# Patient Record
Sex: Female | Born: 1990 | Race: White | Hispanic: No | Marital: Married | Smoking: Never smoker
Health system: Southern US, Community
[De-identification: ages and names within clinical notes are randomized; demographics above are authoritative.]

## PROBLEM LIST (undated history)

## (undated) DIAGNOSIS — O139 Gestational [pregnancy-induced] hypertension without significant proteinuria, unspecified trimester: Secondary | ICD-10-CM

## (undated) DIAGNOSIS — Z789 Other specified health status: Secondary | ICD-10-CM

## (undated) DIAGNOSIS — I10 Essential (primary) hypertension: Secondary | ICD-10-CM

---
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
## 2013-07-28 IMAGING — CR DG ABDOMEN ACUTE W/ 1V CHEST
3 series · 3 of 3 positions shown · non-contrast
Comparison: None.

CLINICAL DATA: 20-year-old female with abdominal pain, nausea.

ACUTE ABDOMEN SERIES (ABDOMEN 2 VIEW & CHEST 1 VIEW)

[w chest pa]
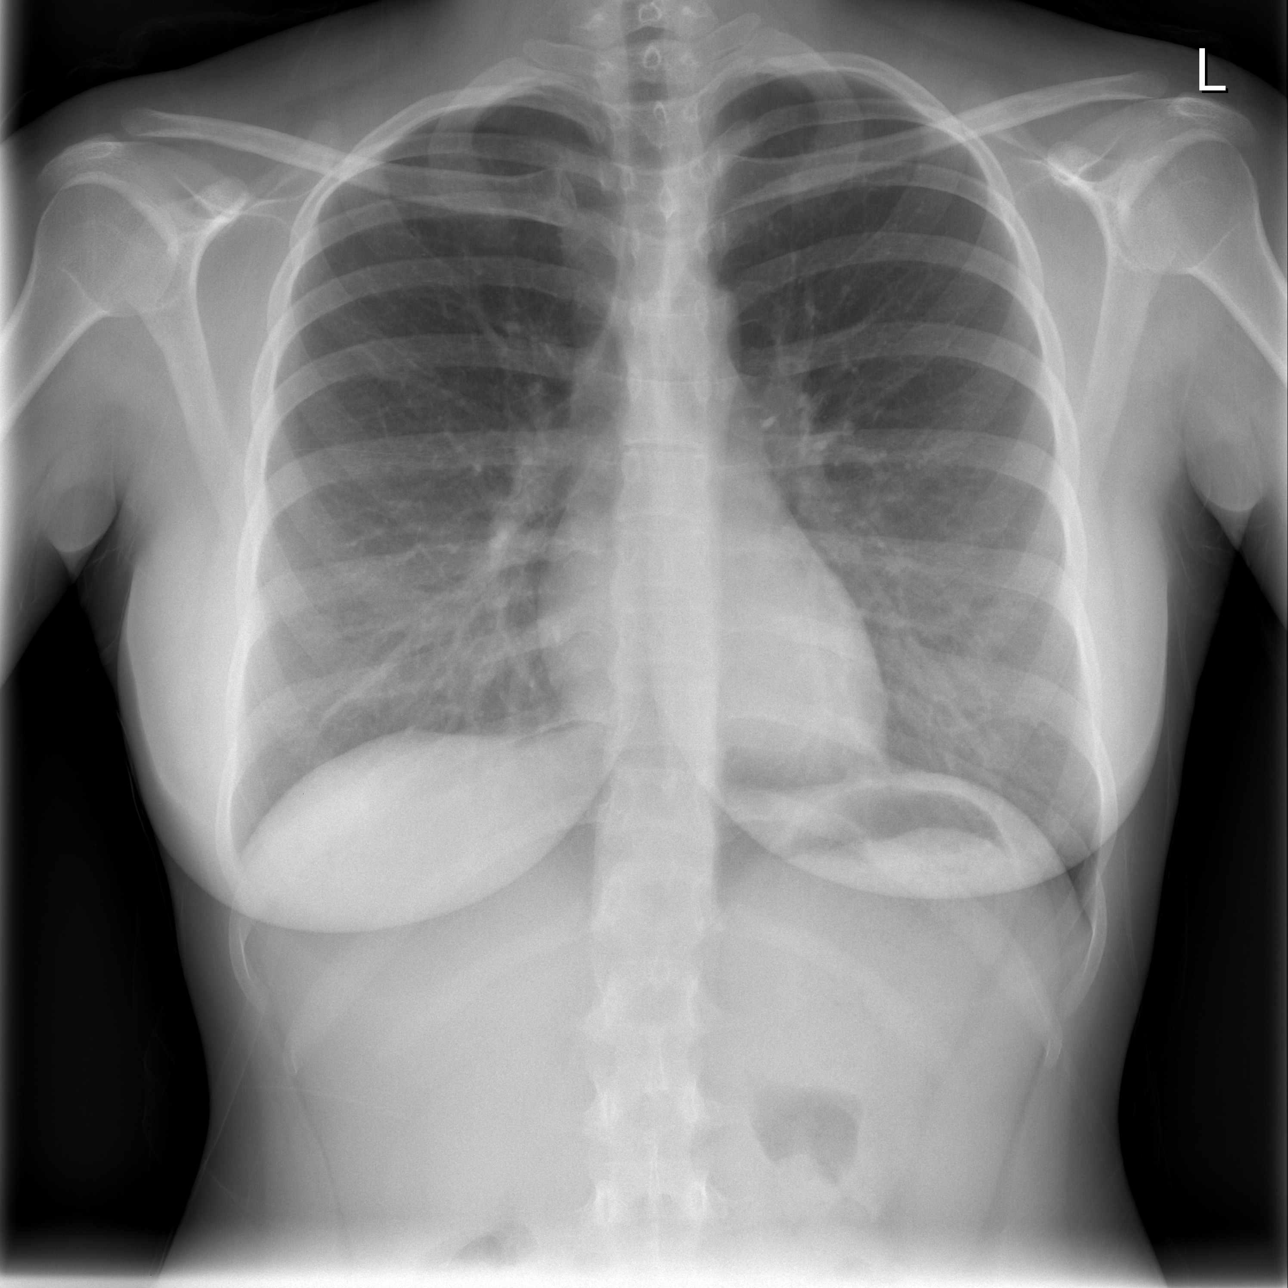

[w abdomen upright *]
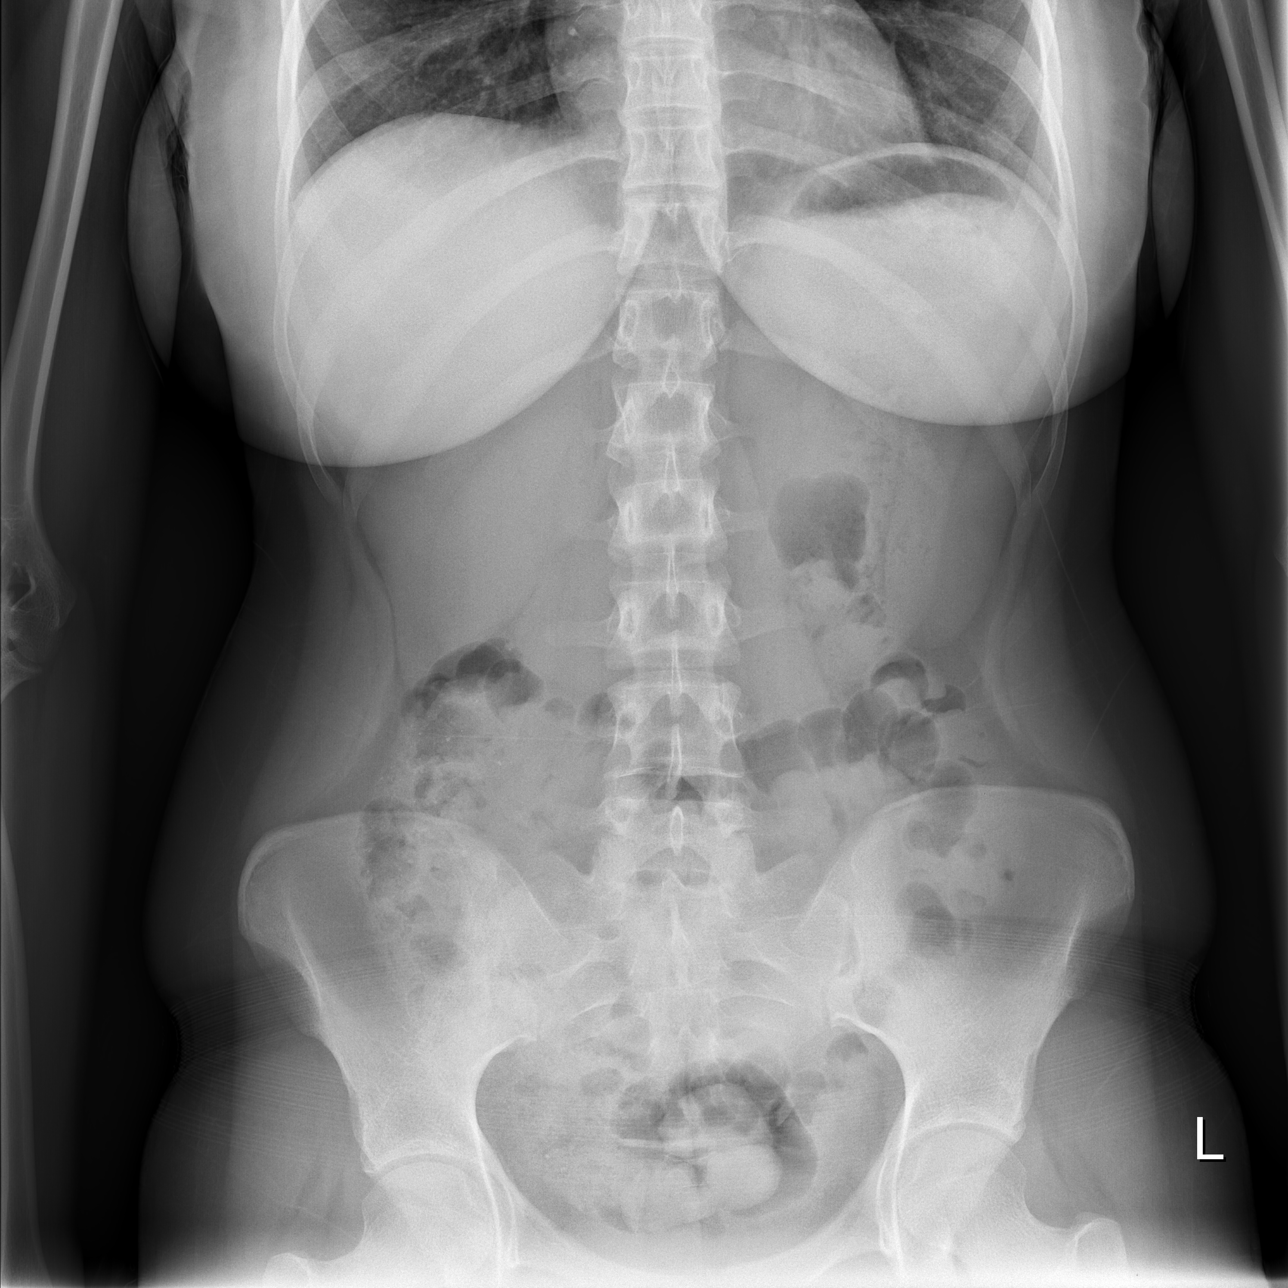

[t abdomen supine]
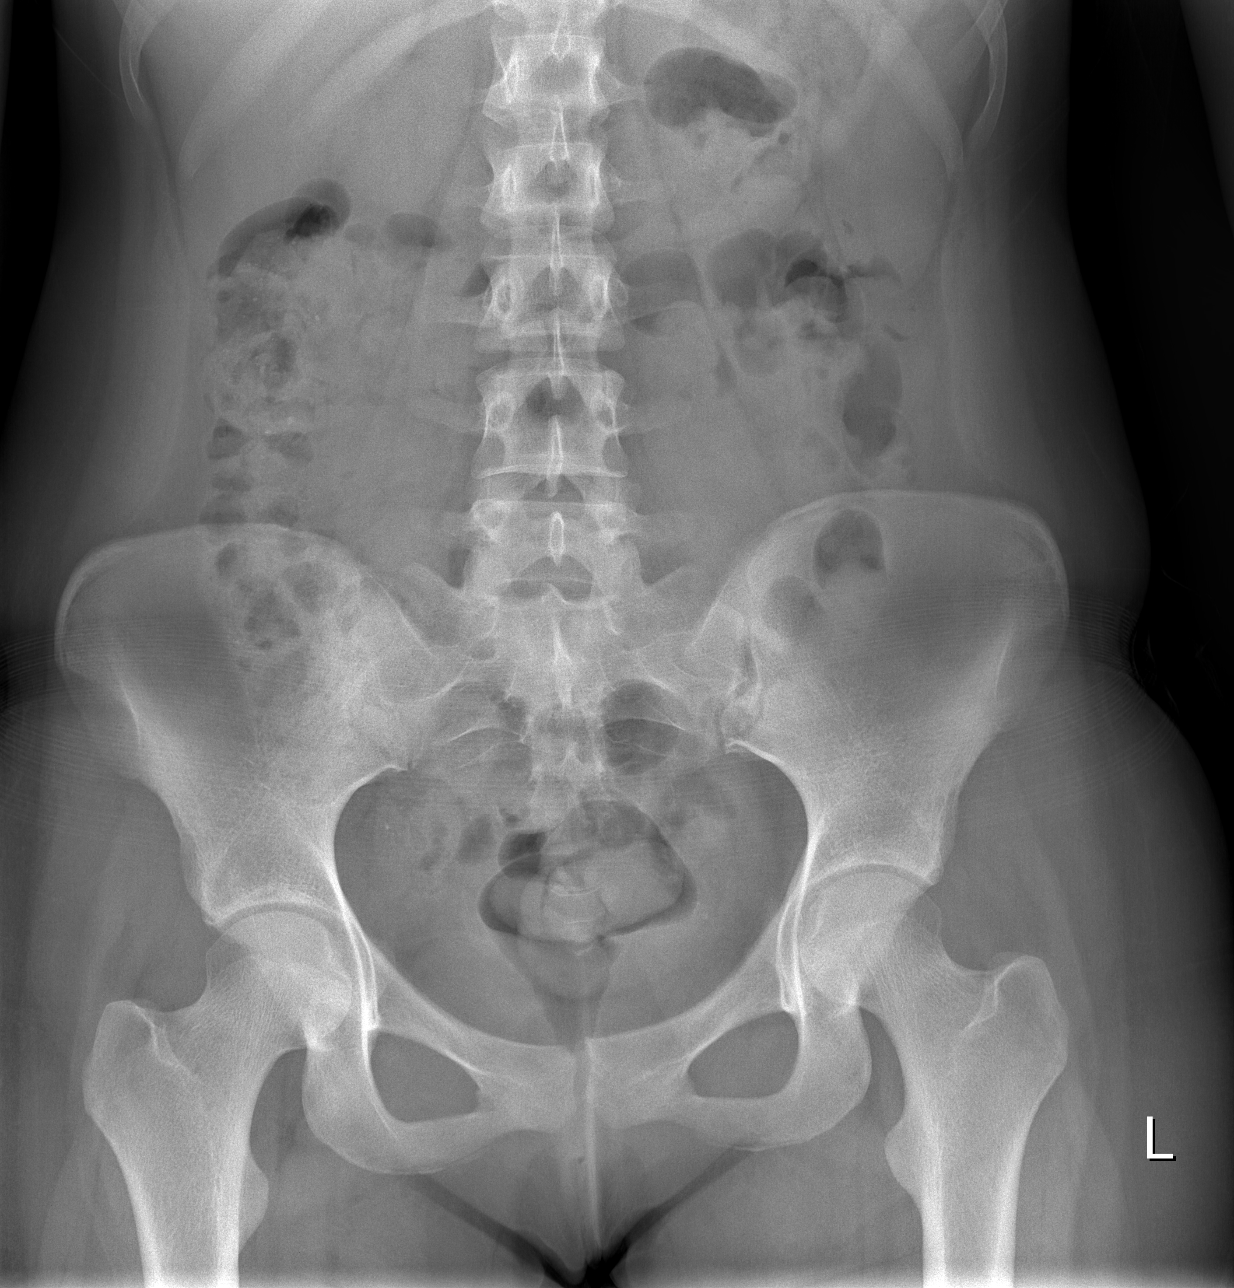

[3 of 3 positions shown; findings below may reference images not displayed]

FINDINGS: Normal cardiac size and mediastinal contours.  Visualized
tracheal air column is within normal limits.  Normal lung volumes.
Mild crowding of infrahilar markings, otherwise the lungs are
clear.  No pneumothorax or pneumoperitoneum.

Nonobstructed bowel gas pattern.  Abdominal and pelvic visceral
contours are within normal limits. No osseous abnormality
identified.
IMPRESSION: Nonobstructed bowel gas pattern, no free air.  Negative chest
except for minor lung base atelectasis.

## 2018-10-21 NOTE — L&D Delivery Note (Signed)
Delivery Note   Jacqueline Parker, Jacqueline Parker [[id number]]  At 10:24 AM a viable and healthy female was delivered via Vaginal, Spontaneous (Presentation: OA ).  APGAR: 9, 9; weight 5 lb 0.1 oz (2270 g).   Placenta status: spont ,intact .  Cord:  with the following complications: none.  Anesthesia:  epidural Episiotomy: None Lacerations: 2nd degree;Perineal Suture Repair: 2.0 vicryl rapide Est. Blood Loss (mL): 145    Hiraldo, Jacqueline Parker [[id number]]  At 10:32 AM a viable and healthy female was delivered via Vaginal, Vacuum (Extractor) (Presentation: ;OP ). 2 pulls . One pop off.Indication: Deep variable decelerations. Pt and husband verbally consented. RIsks vs benefits of VAVD discussed.   APGAR: 8, 9; weight 4 lb 9.4 oz (2080 g).   Placenta status: manual, intact.  Cord:  with the following complications: cord avulsion.  Anesthesia:  epidural Episiotomy: None Lacerations: 2nd degree;Perineal Suture Repair: 2.0 vicryl rapide Est. Blood Loss (mL): 145   Mom to postpartum.   Baby A to NICU Baby B to NICU.  Izrael Peak J 08/12/2019, 12:29 PM

## 2019-04-27 LAB — OB RESULTS CONSOLE GC/CHLAMYDIA
Chlamydia: NEGATIVE
Gonorrhea: NEGATIVE

## 2019-04-27 LAB — OB RESULTS CONSOLE RPR: RPR: NONREACTIVE

## 2019-04-27 LAB — OB RESULTS CONSOLE HIV ANTIBODY (ROUTINE TESTING): HIV: NONREACTIVE

## 2019-04-27 LAB — OB RESULTS CONSOLE RUBELLA ANTIBODY, IGM: Rubella: IMMUNE

## 2019-07-29 ENCOUNTER — Ambulatory Visit (HOSPITAL_COMMUNITY)
Admission: RE | Admit: 2019-07-29 | Discharge: 2019-07-29 | Disposition: A | Discharge status: Home / Self Care | Payer: Managed Care, Other (non HMO) | Source: Ambulatory Visit | Attending: Physician Assistant | Admitting: Physician Assistant

## 2019-07-29 ENCOUNTER — Other Ambulatory Visit (HOSPITAL_COMMUNITY): Payer: Self-pay | Admitting: Obstetrics and Gynecology

## 2019-07-29 ENCOUNTER — Other Ambulatory Visit: Payer: Self-pay

## 2019-07-29 DIAGNOSIS — M79604 Pain in right leg: Secondary | ICD-10-CM | POA: Diagnosis present

## 2019-07-29 DIAGNOSIS — M7989 Other specified soft tissue disorders: Secondary | ICD-10-CM | POA: Diagnosis not present

## 2019-07-29 NOTE — Progress Notes (Signed)
Right lower ext venous  has been completed. Refer to [facility] under chart review to view preliminary results. Results given to Dr. Ronita Hipps.  07/29/2019  2:58 PM Alyric Parkin, Bonnye Fava

## 2019-07-30 ENCOUNTER — Other Ambulatory Visit (HOSPITAL_COMMUNITY): Payer: Self-pay | Admitting: Obstetrics and Gynecology

## 2019-07-30 DIAGNOSIS — R609 Edema, unspecified: Secondary | ICD-10-CM

## 2019-08-06 ENCOUNTER — Other Ambulatory Visit: Payer: Self-pay

## 2019-08-06 ENCOUNTER — Ambulatory Visit (HOSPITAL_BASED_OUTPATIENT_CLINIC_OR_DEPARTMENT_OTHER)
Admission: RE | Admit: 2019-08-06 | Discharge: 2019-08-06 | Disposition: A | Discharge status: Home / Self Care | Payer: Managed Care, Other (non HMO) | Source: Ambulatory Visit | Attending: Obstetrics and Gynecology | Admitting: Obstetrics and Gynecology

## 2019-08-06 DIAGNOSIS — O1404 Mild to moderate pre-eclampsia, complicating childbirth: Secondary | ICD-10-CM | POA: Diagnosis not present

## 2019-08-06 DIAGNOSIS — Z3A33 33 weeks gestation of pregnancy: Secondary | ICD-10-CM | POA: Diagnosis not present

## 2019-08-06 DIAGNOSIS — R609 Edema, unspecified: Secondary | ICD-10-CM

## 2019-08-06 DIAGNOSIS — O1493 Unspecified pre-eclampsia, third trimester: Secondary | ICD-10-CM | POA: Diagnosis not present

## 2019-08-06 DIAGNOSIS — O1413 Severe pre-eclampsia, third trimester: Secondary | ICD-10-CM | POA: Diagnosis not present

## 2019-08-06 DIAGNOSIS — O1203 Gestational edema, third trimester: Secondary | ICD-10-CM | POA: Diagnosis not present

## 2019-08-06 DIAGNOSIS — O30043 Twin pregnancy, dichorionic/diamniotic, third trimester: Secondary | ICD-10-CM | POA: Diagnosis not present

## 2019-08-06 NOTE — Progress Notes (Signed)
RLE venous duplex       has been completed. Preliminary results can be found under CV proc through chart review. June Leap, BS, RDMS, RVT    Attempted call report to Dr. Ronita Hipps with no answer. Patient is still negative for DVT today, will let patient leave.

## 2019-08-07 ENCOUNTER — Other Ambulatory Visit: Payer: Self-pay

## 2019-08-07 ENCOUNTER — Encounter (HOSPITAL_COMMUNITY): Payer: Self-pay

## 2019-08-07 ENCOUNTER — Inpatient Hospital Stay (EMERGENCY_DEPARTMENT_HOSPITAL)
Admission: AD | Admit: 2019-08-07 | Discharge: 2019-08-07 | Disposition: A | Discharge status: Home / Self Care | Payer: Managed Care, Other (non HMO) | Source: Home / Self Care | Attending: Obstetrics and Gynecology | Admitting: Obstetrics and Gynecology

## 2019-08-07 DIAGNOSIS — Z3A33 33 weeks gestation of pregnancy: Secondary | ICD-10-CM

## 2019-08-07 DIAGNOSIS — O1493 Unspecified pre-eclampsia, third trimester: Secondary | ICD-10-CM

## 2019-08-07 DIAGNOSIS — O30043 Twin pregnancy, dichorionic/diamniotic, third trimester: Secondary | ICD-10-CM | POA: Diagnosis not present

## 2019-08-07 DIAGNOSIS — O1203 Gestational edema, third trimester: Secondary | ICD-10-CM

## 2019-08-07 HISTORY — DX: Other specified health status: Z78.9

## 2019-08-07 LAB — CBC
HCT: 34.1 % — ABNORMAL LOW (ref 36.0–46.0)
Hemoglobin: 12.2 g/dL (ref 12.0–15.0)
MCH: 32.1 pg (ref 26.0–34.0)
MCHC: 35.8 g/dL (ref 30.0–36.0)
MCV: 89.7 fL (ref 80.0–100.0)
Platelets: 147 10*3/uL — ABNORMAL LOW (ref 150–400)
RBC: 3.8 MIL/uL — ABNORMAL LOW (ref 3.87–5.11)
RDW: 12.6 % (ref 11.5–15.5)
WBC: 10 10*3/uL (ref 4.0–10.5)
nRBC: 0 % (ref 0.0–0.2)

## 2019-08-07 LAB — URINALYSIS, ROUTINE W REFLEX MICROSCOPIC
Bilirubin Urine: NEGATIVE
Glucose, UA: NEGATIVE mg/dL
Ketones, ur: NEGATIVE mg/dL
Leukocytes,Ua: NEGATIVE
Nitrite: NEGATIVE
Protein, ur: >=300 mg/dL — AB
Specific Gravity, Urine: 1.025 (ref 1.005–1.030)
WBC, UA: >50 WBC/hpf — ABNORMAL HIGH (ref 0–5)
pH: 5 (ref 5.0–8.0)

## 2019-08-07 LAB — COMPREHENSIVE METABOLIC PANEL
ALT: 17 U/L (ref 0–44)
AST: 22 U/L (ref 15–41)
Albumin: 2.3 g/dL — ABNORMAL LOW (ref 3.5–5.0)
Alkaline Phosphatase: 204 U/L — ABNORMAL HIGH (ref 38–126)
Anion gap: 10 (ref 5–15)
BUN: 19 mg/dL (ref 6–20)
CO2: 20 mmol/L — ABNORMAL LOW (ref 22–32)
Calcium: 8.3 mg/dL — ABNORMAL LOW (ref 8.9–10.3)
Chloride: 104 mmol/L (ref 98–111)
Creatinine, Ser: 0.95 mg/dL (ref 0.44–1.00)
GFR calc Af Amer: >60 mL/min (ref >60)
GFR calc non Af Amer: >60 mL/min (ref >60)
Glucose, Bld: 89 mg/dL (ref 70–99)
Potassium: 4.2 mmol/L (ref 3.5–5.1)
Sodium: 134 mmol/L — ABNORMAL LOW (ref 135–145)
Total Bilirubin: 0.7 mg/dL (ref 0.3–1.2)
Total Protein: 5.3 g/dL — ABNORMAL LOW (ref 6.5–8.1)

## 2019-08-07 LAB — PROTEIN / CREATININE RATIO, URINE
Creatinine, Urine: 242.54 mg/dL
Protein Creatinine Ratio: 5.37 mg/mg{Cre} — ABNORMAL HIGH (ref 0.00–0.15)
Total Protein, Urine: 1303 mg/dL

## 2019-08-07 MED ORDER — COMFORT FIT MATERNITY SUPP SM MISC: 1.0000 [IU] | Freq: Every day | 0 refills | Status: DC | PRN

## 2019-08-07 NOTE — MAU Provider Note (Signed)
Chief Complaint:  Edema   First Provider Initiated Contact with Patient 08/07/19 [phone]     HPI: Jacqueline Parker is a 28 y.o. G1P0 at [redacted]w[redacted]d with di/di twins who presents to maternity admissions reporting lower extremity swelling. Has had ongoing LE swelling for the last few weeks that has worsened recently Has had 2 venous doppler studies in the last 9 days, both were negative. Swelling is worse in right leg than left & makes it uncomfortable to walk & bend her legs. Denies calf pain, cough, or SOB.  Denies contractions, leakage of fluid or vaginal bleeding. Good fetal movement x2.  State her BP have been going up recently so had preeclampsia labs done yesterday in the office. Says they were normal. Her BP hasn't been elevated just higher than her normal, yesterday was 130s/80s. Denies headache, visual disturbance, or epigastric pain.    Past Medical History:  Diagnosis Date  . Medical history non-contributory    OB History  Gravida Para Term Preterm AB Living  1            SAB TAB Ectopic Multiple Live Births               # Outcome Date GA Lbr Len/2nd Weight Sex Delivery Anes PTL Lv  1 Current            Past Surgical History:  Procedure Laterality Date  . BREAST LUMPECTOMY     right  . WISDOM TOOTH EXTRACTION     Family History  Problem Relation Age of Onset  . Cancer Mother        breast  . Diabetes Father        type 2   Social History   Tobacco Use  . Smoking status: Never Smoker  . Smokeless tobacco: Never Used  Substance Use Topics  . Alcohol use: Never    Frequency: Never  . Drug use: Never   Allergies  Allergen Reactions  . Penicillins Hives   No medications prior to admission.    I have reviewed patient's Past Medical Hx, Surgical Hx, Family Hx, Social Hx, medications and allergies.   ROS:  Review of Systems  Constitutional: Negative.   Eyes: Negative for photophobia.  Respiratory: Negative for cough and shortness of breath.   Cardiovascular: Positive  for leg swelling. Negative for chest pain.  Gastrointestinal: Negative.   Genitourinary: Negative.   Neurological: Negative for headaches.    Physical Exam   Patient Vitals for the past 24 hrs:  BP Temp Temp src Pulse Resp SpO2  08/07/19 1324 - 98.3 F (36.8 C) Oral 80 18 97 %  08/07/19 1245 136/88 - - 80 - 96 %  08/07/19 1230 (!) 148/90 - - 78 - 95 %  08/07/19 1215 (!) 141/88 - - 85 - 94 %  08/07/19 1200 (!) 140/109 - - 81 - 94 %  08/07/19 1145 (!) 145/93 - - 81 - -  08/07/19 1131 138/86 - - 76 - -  08/07/19 1116 (!) 151/82 - - 89 - -  08/07/19 1100 (!) 146/88 - - 81 - 96 %  08/07/19 1045 (!) 136/92 - - 90 - 95 %  08/07/19 1030 135/89 - - 89 - 97 %  08/07/19 1015 138/88 - - 91 - 96 %  08/07/19 1000 140/90 - - 93 - 96 %  08/07/19 0945 (!) 136/93 - - 90 - 97 %  08/07/19 0942 (!) 141/93 - - 91 - -    Constitutional: Well-developed,  well-nourished female in no acute distress.  Cardiovascular: normal rate & rhythm, no murmur Respiratory: normal effort, lung sounds clear throughout GI: Abd soft, non-tender, gravid appropriate for gestational age. Pos BS x 4 MS: 3+ pitting edema in BLE. Rt calf= 43 cm, Lt calf= 41 cm. No erythema or tenderness. Extremities nontender, normal ROM Neurologic: Alert and oriented x 4. DTR 2+, no clonus  Fetal Tracing: Baby A Baseline: 140 Variability: moderate Accelerations: 15x15 Decelerations: none  Baby B Baseline: 145  Variability: moderate Accelerations: 15x15 Decelerations: none  Toco: Q10-15 mins, pt denies    Labs: Results for orders placed or performed during the hospital encounter of 08/07/19 (from the past 24 hour(s))  Urinalysis, Routine w reflex microscopic     Status: Abnormal   Collection Time: 08/07/19  9:37 AM  Result Value Ref Range   Color, Urine AMBER (A) YELLOW   APPearance CLOUDY (A) CLEAR   Specific Gravity, Urine 1.025 1.005 - 1.030   pH 5.0 5.0 - 8.0   Glucose, UA NEGATIVE NEGATIVE mg/dL   Hgb urine dipstick  SMALL (A) NEGATIVE   Bilirubin Urine NEGATIVE NEGATIVE   Ketones, ur NEGATIVE NEGATIVE mg/dL   Protein, ur >=300 (A) NEGATIVE mg/dL   Nitrite NEGATIVE NEGATIVE   Leukocytes,Ua NEGATIVE NEGATIVE   RBC / HPF 6-10 0 - 5 RBC/hpf   WBC, UA >50 (H) 0 - 5 WBC/hpf   Bacteria, UA FEW (A) NONE SEEN   Squamous Epithelial / LPF 11-20 0 - 5   Mucus PRESENT    Hyaline Casts, UA PRESENT    Granular Casts, UA PRESENT   Protein / creatinine ratio, urine     Status: Abnormal   Collection Time: 08/07/19  9:37 AM  Result Value Ref Range   Creatinine, Urine 242.54 mg/dL   Total Protein, Urine 1,303 mg/dL   Protein Creatinine Ratio 5.37 (H) 0.00 - 0.15 mg/mg[Cre]  CBC     Status: Abnormal   Collection Time: 08/07/19 11:03 AM  Result Value Ref Range   WBC 10.0 4.0 - 10.5 K/uL   RBC 3.80 (L) 3.87 - 5.11 MIL/uL   Hemoglobin 12.2 12.0 - 15.0 g/dL   HCT 34.1 (L) 36.0 - 46.0 %   MCV 89.7 80.0 - 100.0 fL   MCH 32.1 26.0 - 34.0 pg   MCHC 35.8 30.0 - 36.0 g/dL   RDW 12.6 11.5 - 15.5 %   Platelets 147 (L) 150 - 400 K/uL   nRBC 0.0 0.0 - 0.2 %  Comprehensive metabolic panel     Status: Abnormal   Collection Time: 08/07/19 11:03 AM  Result Value Ref Range   Sodium 134 (L) 135 - 145 mmol/L   Potassium 4.2 3.5 - 5.1 mmol/L   Chloride 104 98 - 111 mmol/L   CO2 20 (L) 22 - 32 mmol/L   Glucose, Bld 89 70 - 99 mg/dL   BUN 19 6 - 20 mg/dL   Creatinine, Ser 0.95 0.44 - 1.00 mg/dL   Calcium 8.3 (L) 8.9 - 10.3 mg/dL   Total Protein 5.3 (L) 6.5 - 8.1 g/dL   Albumin 2.3 (L) 3.5 - 5.0 g/dL   AST 22 15 - 41 U/L   ALT 17 0 - 44 U/L   Alkaline Phosphatase 204 (H) 38 - 126 U/L   Total Bilirubin 0.7 0.3 - 1.2 mg/dL   GFR calc non Af Amer >60 >60 mL/min   GFR calc Af Amer >60 >60 mL/min   Anion gap 10 5 - 15  Imaging:  No results found.  MAU Course: Orders Placed This Encounter  Procedures  . Urinalysis, Routine w reflex microscopic  . CBC  . Comprehensive metabolic panel  . Protein / creatinine ratio,  urine  . Discharge patient   Meds ordered this encounter  Medications  . Elastic Bandages & Supports (COMFORT FIT MATERNITY SUPP SM) MISC    Sig: 1 Units by Does not apply route daily as needed.    Dispense:  1 each    Refill:  0    Order Specific Question:   Supervising Provider    Answer:   Reva BoresPRATT, TANYA S [[id number]]    MDM: Elevated BPs. None severe range. Pt asymptomatic. PEC labs ordered.   Significant swelling of bilateral lower extremities. 2 cm difference between calves. Negative homans, no tenderness, no erythema. Negative venous doppler yesterday.  No cough, SOB, or chest pain. SpO2 >97%  Platelets 147 compared to 143 in office last week. AST/ALT normal. Serum creatinine up to 0.95 from 0.77 in office last week. Urine PCR 5.37, as 2.8 last week.   Discussed patient with Dr. Shawnie PonsPratt. Stable to be discharged home with strict return precautions. Pt has close follow up in the office on Monday. Dr. Billy Coastaavon notified of patient's labs, BPs, and plan.   Assessment: 1. Pre-eclampsia in third trimester   2. [redacted] weeks gestation of pregnancy   3. Dichorionic diamniotic twin pregnancy in third trimester   4. Swelling of lower extremity during pregnancy in third trimester     Plan: Discharge home in stable condition.  Strict return precautions regarding preeclampsia discussed PTL precautions Rx maternity support belt Discussed leg elevation, frequent position changes, water intake, & compression socks for LE edema, also reviewed concerning s/s for VTE Follow-up Information    Olivia Mackieaavon, Richard, MD Follow up.   Specialty: Obstetrics and Gynecology Why: keep scheduled appointment on Monday or call sooner if needed Contact information: [address] [place] [id number] [phone]        [facility] Follow up.   Specialty: Obstetrics and Gynecology Why: return for worsening symptoms Contact information: [address] [id number] [address] [id number] [phone]          Allergies as of 08/07/2019      Reactions   Penicillins Hives      Medication List    TAKE these medications   Comfort Fit Maternity Supp Sm Misc 1 Units by Does not apply route daily as needed.       Judeth HornLawrence, Lorilee Cafarella, NP 08/07/2019 2:40 PM

## 2019-08-07 NOTE — Discharge Instructions (Signed)
Hypertension During Pregnancy °High blood pressure (hypertension) is when the force of blood pumping through the arteries is too strong. Arteries are blood vessels that carry blood from the heart throughout the body. Hypertension during pregnancy can be mild or severe. Severe hypertension during pregnancy (preeclampsia) is a medical emergency that requires prompt evaluation and treatment. °Different types of hypertension can happen during pregnancy. These include: °· Chronic hypertension. This happens when you had high blood pressure before you became pregnant, and it continues during the pregnancy. Hypertension that develops before you are [redacted] weeks pregnant and continues during the pregnancy is also called chronic hypertension. If you have chronic hypertension, it will not go away after you have your baby. You will need follow-up visits with your health care provider after you have your baby. Your doctor may want you to keep taking medicine for your blood pressure. °· Gestational hypertension. This is hypertension that develops after the 20th week of pregnancy. Gestational hypertension usually goes away after you have your baby, but your health care provider will need to monitor your blood pressure to make sure that it is getting better. °· Preeclampsia. This is severe hypertension during pregnancy. This can cause serious complications for you and your baby and can also cause complications for you after the delivery of your baby. °· Postpartum preeclampsia. You may develop severe hypertension after giving birth. This usually occurs within 48 hours after childbirth but may occur up to 6 weeks after giving birth. This is rare. °How does this affect me? °Women who have hypertension during pregnancy have a greater chance of developing hypertension later in life or during future pregnancies. In some cases, hypertension during pregnancy can cause serious complications, such as: °· Stroke. °· Heart attack. °· Injury to  other organs, such as kidneys, lungs, or liver. °· Preeclampsia. °· Convulsions or seizures. °· Placental abruption. °How does this affect my baby? °Hypertension during pregnancy can affect your baby. Your baby may: °· Be born early (prematurely). °· Not weigh as much as he or she should at birth (low birth weight). °· Not tolerate labor well, leading to an unplanned cesarean delivery. °What are the risks? °There are certain factors that make it more likely for you to develop hypertension during pregnancy. These include: °· Having hypertension during a previous pregnancy. °· Being overweight. °· Being age 35 or older. °· Being pregnant for the first time. °· Being pregnant with more than one baby. °· Becoming pregnant using fertilization methods, such as IVF (in vitro fertilization). °· Having other medical problems, such as diabetes, kidney disease, or lupus. °· Having a family history of hypertension. °What can I do to lower my risk? °The exact cause of hypertension during pregnancy is not known. You may be able to lower your risk by: °· Maintaining a healthy weight. °· Eating a healthy and balanced diet. °· Following your health care provider's instructions about treating any long-term conditions that you had before becoming pregnant. °It is very important to keep all of your prenatal care appointments. Your health care provider will check your blood pressure and make sure that your pregnancy is progressing as expected. If a problem is found, early treatment can prevent complications. °How is this treated? °Treatment for hypertension during pregnancy varies depending on the type of hypertension you have and how serious it is. °· If you were taking medicine for high blood pressure before you became pregnant, talk with your health care provider. You may need to change medicine during pregnancy because   some medicines, like ACE inhibitors, may not be considered safe for your baby.  If you have gestational  hypertension, your health care provider may order medicine to treat this during pregnancy.  If you are at risk for preeclampsia, your health care provider may recommend that you take a low-dose aspirin during your pregnancy.  If you have severe hypertension, you may need to be hospitalized so you and your baby can be monitored closely. You may also need to be given medicine to lower your blood pressure. This medicine may be given by mouth or through an IV.  In some cases, if your condition gets worse, you may need to deliver your baby early. Follow these instructions at home: Eating and drinking   Drink enough fluid to keep your urine pale yellow.  Avoid caffeine. Lifestyle  Do not use any products that contain nicotine or tobacco, such as cigarettes, e-cigarettes, and chewing tobacco. If you need help quitting, ask your health care provider.  Do not use alcohol or drugs.  Avoid stress as much as possible.  Rest and get plenty of sleep.  Regular exercise can help to reduce your blood pressure. Ask your health care provider what kinds of exercise are best for you. General instructions  Take over-the-counter and prescription medicines only as told by your health care provider.  Keep all prenatal and follow-up visits as told by your health care provider. This is important. Contact a health care provider if:  You have symptoms that your health care provider told you may require more treatment or monitoring, such as: ? Headaches. ? Nausea or vomiting. ? Abdominal pain. ? Dizziness. ? Light-headedness. Get help right away if:  You have: ? Severe abdominal pain that does not get better with treatment. ? A severe headache that does not get better. ? Vomiting that does not get better. ? Sudden, rapid weight gain. ? Sudden swelling in your hands, ankles, or face. ? Vaginal bleeding. ? Blood in your urine. ? Blurred or double vision. ? Shortness of breath or chest  pain. ? Weakness on one side of your body. ? Difficulty speaking.  Your baby is not moving as much as usual. Summary  High blood pressure (hypertension) is when the force of blood pumping through the arteries is too strong.  Hypertension during pregnancy can cause problems for you and your baby.  Treatment for hypertension during pregnancy varies depending on the type of hypertension you have and how serious it is.  Keep all prenatal and follow-up visits as told by your health care provider. This is important. This information is not intended to replace advice given to you by your health care provider. Make sure you discuss any questions you have with your health care provider. Document Released: 06/25/2011 Document Revised: 01/28/2019 Document Reviewed: 11/03/2018 Elsevier Patient Education  2020 [employer]     Peripheral Edema  Peripheral edema is swelling that is caused by a buildup of fluid. Peripheral edema most often affects the lower legs, ankles, and feet. It can also develop in the arms, hands, and face. The area of the body that has peripheral edema will look swollen. It may also feel heavy or warm. Your clothes may start to feel tight. Pressing on the area may make a temporary dent in your skin. You may not be able to move your swollen arm or leg as much as usual. There are many causes of peripheral edema. It can happen because of a complication of other conditions such as  congestive heart failure, kidney disease, or a problem with your blood circulation. It also can be a side effect of certain medicines or because of an infection. It often happens to women during pregnancy. Sometimes, the cause is not known. Follow these instructions at home: Managing pain, stiffness, and swelling   Raise (elevate) your legs while you are sitting or lying down.  Move around often to prevent stiffness and to lessen swelling.  Do not sit or stand for long periods of time.  Wear  support stockings as told by your health care provider. Medicines  Take over-the-counter and prescription medicines only as told by your health care provider.  Your health care provider may prescribe medicine to help your body get rid of excess water (diuretic). General instructions  Pay attention to any changes in your symptoms.  Follow instructions from your health care provider about limiting salt (sodium) in your diet. Sometimes, eating less salt may reduce swelling.  Moisturize skin daily to help prevent skin from cracking and draining.  Keep all follow-up visits as told by your health care provider. This is important. Contact a health care provider if you have:  A fever.  Edema that starts suddenly or is getting worse, especially if you are pregnant or have a medical condition.  Swelling in only one leg.  Increased swelling, redness, or pain in one or both of your legs.  Drainage or sores at the area where you have edema. Get help right away if you:  Develop shortness of breath, especially when you are lying down.  Have pain in your chest or abdomen.  Feel weak.  Feel faint. Summary  Peripheral edema is swelling that is caused by a buildup of fluid. Peripheral edema most often affects the lower legs, ankles, and feet.  Move around often to prevent stiffness and to lessen swelling. Do not sit or stand for long periods of time.  Pay attention to any changes in your symptoms.  Contact a health care provider if you have edema that starts suddenly or is getting worse, especially if you are pregnant or have a medical condition.  Get help right away if you develop shortness of breath, especially when lying down. This information is not intended to replace advice given to you by your health care provider. Make sure you discuss any questions you have with your health care provider. Document Released: 11/14/2004 Document Revised: 07/01/2018 Document Reviewed:  07/01/2018 Elsevier Patient Education  2020 [employer].

## 2019-08-08 ENCOUNTER — Other Ambulatory Visit: Payer: Self-pay

## 2019-08-08 ENCOUNTER — Observation Stay (EMERGENCY_DEPARTMENT_HOSPITAL)
Admission: AD | Admit: 2019-08-08 | Discharge: 2019-08-09 | Disposition: A | Discharge status: Home / Self Care | Payer: Managed Care, Other (non HMO) | Source: Home / Self Care | Attending: Obstetrics | Admitting: Obstetrics

## 2019-08-08 ENCOUNTER — Encounter (HOSPITAL_COMMUNITY): Payer: Self-pay

## 2019-08-08 DIAGNOSIS — O30043 Twin pregnancy, dichorionic/diamniotic, third trimester: Secondary | ICD-10-CM | POA: Diagnosis present

## 2019-08-08 DIAGNOSIS — Z20828 Contact with and (suspected) exposure to other viral communicable diseases: Secondary | ICD-10-CM | POA: Diagnosis present

## 2019-08-08 DIAGNOSIS — R1011 Right upper quadrant pain: Secondary | ICD-10-CM

## 2019-08-08 DIAGNOSIS — O1413 Severe pre-eclampsia, third trimester: Secondary | ICD-10-CM

## 2019-08-08 DIAGNOSIS — O1403 Mild to moderate pre-eclampsia, third trimester: Secondary | ICD-10-CM | POA: Diagnosis present

## 2019-08-08 DIAGNOSIS — Z3A33 33 weeks gestation of pregnancy: Secondary | ICD-10-CM

## 2019-08-08 DIAGNOSIS — Z3689 Encounter for other specified antenatal screening: Secondary | ICD-10-CM

## 2019-08-08 HISTORY — DX: Essential (primary) hypertension: I10

## 2019-08-08 HISTORY — DX: Gestational (pregnancy-induced) hypertension without significant proteinuria, unspecified trimester: O13.9

## 2019-08-08 NOTE — MAU Provider Note (Signed)
History     CSN: [id number]  Arrival date and time: 08/08/19 2238   First Provider Initiated Contact with Patient 08/08/19 2323      Chief Complaint  Patient presents with  . Chest Pain   [name] is a 28 y.o. G1P0 at [redacted]w[redacted]d who presents to MAU for preeclampsia evaluation after vomiting x2 today. Of note, pt was seen in MAU yesterday and diagnosed with preeclampsia.  Pt reports a HA that started this morning, but resolved with Tylenol. Pt reports her HA returned later this afternoon, pt reports she took two extra-strength Tylenol about 2 hours ago (along with Pepcid and Benadryl) and the HA mostly resolved. Pt current rates HA as 2/10. Pt denies issues with HA prior to today in pregnancy and denies history of other HA issues.  Pt reports "a second" or blurry vision earlier today, but denies any blurry vision at this time.  Pt reports nausea since last night with vomiting x1 last night and vomiting x2 today. Pt reports issues with N/V early in pregnancy that stopped around week 20, but denies anything since then until last night.  Pt also reports right-sided abdominal pain since this afternoon, which makes it difficult for her to sit. Pt tried taking a hot shower and Tylenol, but did not relieve the pain. Pt rates pain as 7/10 at worst, at best 4/10. Pt reports pain as sharp and stabbing and intermittently dull and achey. Pt denies any recent trauma.  Pt denies blurry vision/seeing spots, swelling in face and hands, sudden weight gain. Pt denies chest pain and SOB.  Pt denies constipation, diarrhea, or urinary problems. Pt denies fever, chills, fatigue, sweating or changes in appetite. Pt denies dizziness, light-headedness, weakness.  Pt denies VB, ctx, LOF and reports good FM.  Current pregnancy problems? Di-di twins, preeclampsia Allergies? PCN Current medications? PNVs, Ca, Fish Oil, Mg Current PNC & next appt? [facility], next appt 08/09/2019   OB History    Gravida  1   Para      Term      Preterm      AB      Living        SAB      TAB      Ectopic      Multiple      Live Births              Past Medical History:  Diagnosis Date  . Hypertension   . Medical history non-contributory   . Pregnancy induced hypertension     Past Surgical History:  Procedure Laterality Date  . BREAST LUMPECTOMY     right  . WISDOM TOOTH EXTRACTION      Family History  Problem Relation Age of Onset  . Cancer Mother        breast  . Diabetes Father        type 2    Social History   Tobacco Use  . Smoking status: Never Smoker  . Smokeless tobacco: Never Used  Substance Use Topics  . Alcohol use: Never    Frequency: Never  . Drug use: Never    Allergies:  Allergies  Allergen Reactions  . Penicillins Hives    Medications Prior to Admission  Medication Sig Dispense Refill Last Dose  . acetaminophen (TYLENOL) 500 MG tablet Take 1,000 mg by mouth every 6 (six) hours as needed.   08/08/2019 at Unknown time  . diphenhydrAMINE (BENADRYL) 25 MG tablet Take 25 mg  by mouth every 6 (six) hours as needed.   08/08/2019 at Unknown time  . famotidine (PEPCID) 20 MG tablet Take 20 mg by mouth 2 (two) times daily.   08/08/2019 at Unknown time  . Elastic Bandages & Supports (COMFORT FIT MATERNITY SUPP SM) MISC 1 Units by Does not apply route daily as needed. 1 each 0     Review of Systems  Constitutional: Negative for chills, diaphoresis, fatigue and fever.  Eyes: Negative for visual disturbance.  Respiratory: Negative for shortness of breath.   Cardiovascular: Negative for chest pain.  Gastrointestinal: Positive for abdominal pain (right-sided), nausea and vomiting. Negative for constipation and diarrhea.  Genitourinary: Negative for dysuria, flank pain, frequency, pelvic pain, urgency, vaginal bleeding and vaginal discharge.  Neurological: Positive for headaches. Negative for dizziness, weakness and light-headedness.   Physical  Exam   Blood pressure (!) 146/91, pulse 70, temperature 98.8 F (37.1 C), temperature source Oral, resp. rate 18, height 5\' 4"  (1.626 m), weight 91.6 kg, SpO2 98 %.  Patient Vitals for the past 24 hrs:  BP Temp Temp src Pulse Resp SpO2 Height Weight  08/09/19 0300 (!) 146/91 - - 70 - - - -  08/09/19 0245 (!) 153/101 - - 82 - - - -  08/09/19 0230 (!) 143/94 - - 74 - - - -  08/09/19 0215 136/88 - - 72 - - - -  08/09/19 0200 128/86 - - 83 - - - -  08/09/19 0146 139/85 - - 81 - - - -  08/09/19 0030 (!) 152/90 - - 73 - - - -  08/09/19 0015 (!) 147/91 - - 80 - - - -  08/09/19 0000 (!) 148/93 - - 85 - - - -  08/08/19 2345 (!) 146/91 - - 81 - - - -  08/08/19 2330 (!) 153/101 - - 87 - - - -  08/08/19 2317 139/90 - - 86 - - - -  08/08/19 2303 (!) 149/92 98.8 F (37.1 C) Oral 85 18 98 % - -  08/08/19 2251 - - - - - - 5\' 4"  (1.626 m) 91.6 kg   Physical Exam  Constitutional: She is oriented to person, place, and time. She appears well-developed and well-nourished. No distress.  HENT:  Head: Normocephalic and atraumatic.  Respiratory: Effort normal.  GI: Soft. She exhibits no distension and no mass. There is abdominal tenderness (no tenderness in RUQ, mild tenderness along right-side of abdomen on palpation over muscle). There is no rebound and no guarding.  Neurological: She is alert and oriented to person, place, and time.  Skin: Skin is warm and dry. She is not diaphoretic.  Psychiatric: She has a normal mood and affect. Her behavior is normal. Judgment and thought content normal.   Results for orders placed or performed during the hospital encounter of 08/08/19 (from the past 24 hour(s))  Protein / creatinine ratio, urine     Status: Abnormal   Collection Time: 08/08/19 11:19 PM  Result Value Ref Range   Creatinine, Urine 178.45 mg/dL   Total Protein, Urine 1,482 mg/dL   Protein Creatinine Ratio 8.30 (H) 0.00 - 0.15 mg/mg[Cre]  Comprehensive metabolic panel     Status: Abnormal    Collection Time: 08/08/19 11:53 PM  Result Value Ref Range   Sodium 136 135 - 145 mmol/L   Potassium 4.6 3.5 - 5.1 mmol/L   Chloride 103 98 - 111 mmol/L   CO2 21 (L) 22 - 32 mmol/L   Glucose, Bld 92 70 -  99 mg/dL   BUN 20 6 - 20 mg/dL   Creatinine, Ser 0.97 0.44 - 1.00 mg/dL   Calcium 8.9 8.9 - 10.3 mg/dL   Total Protein 5.7 (L) 6.5 - 8.1 g/dL   Albumin 2.3 (L) 3.5 - 5.0 g/dL   AST 20 15 - 41 U/L   ALT 15 0 - 44 U/L   Alkaline Phosphatase 215 (H) 38 - 126 U/L   Total Bilirubin 0.9 0.3 - 1.2 mg/dL   GFR calc non Af Amer >60 >60 mL/min   GFR calc Af Amer >60 >60 mL/min   Anion gap 12 5 - 15  CBC with Differential/Platelet     Status: Abnormal   Collection Time: 08/08/19 11:53 PM  Result Value Ref Range   WBC 11.9 (H) 4.0 - 10.5 K/uL   RBC 3.97 3.87 - 5.11 MIL/uL   Hemoglobin 12.4 12.0 - 15.0 g/dL   HCT 36.3 36.0 - 46.0 %   MCV 91.4 80.0 - 100.0 fL   MCH 31.2 26.0 - 34.0 pg   MCHC 34.2 30.0 - 36.0 g/dL   RDW 12.5 11.5 - 15.5 %   Platelets 149 (L) 150 - 400 K/uL   nRBC 0.0 0.0 - 0.2 %   Neutrophils Relative % 78 %   Neutro Abs 9.2 (H) 1.7 - 7.7 K/uL   Lymphocytes Relative 15 %   Lymphs Abs 1.8 0.7 - 4.0 K/uL   Monocytes Relative 6 %   Monocytes Absolute 0.7 0.1 - 1.0 K/uL   Eosinophils Relative 0 %   Eosinophils Absolute 0.0 0.0 - 0.5 K/uL   Basophils Relative 0 %   Basophils Absolute 0.0 0.0 - 0.1 K/uL   Immature Granulocytes 1 %   Abs Immature Granulocytes 0.10 (H) 0.00 - 0.07 K/uL    MAU Course  Procedures  MDM -preeclampsia evaluation with known preeclampsia diagnosis and new-onset N/V, HA, right-sided abdominal pain -no RUQ pain on palpation -CBC: WNL for pregnancy, platelets 149 (were 147 yesterday) -CMP: no worsening of labs, serum creatinine 0.97 (was 0.95 yesterday), AST/ALT 20/15 (was 22/17 yesterday) -PCr: 8.30 (was 5.37 yesterday) -RUQ [place]: normal RUQ [place] -Twin A, EFM: reactive       -baseline: 145       -variability: moderate       -accels: present,  15x15       -decels: absent       -TOCO: irritability -Twin B, EFM: rective       -baseline: 150       -variability: moderate       -accels: present, 15x15       -decels: absent       -TOCO: irritability -pt denies feeling any contractions -called and spoke with Dr. Elonda Husky @0255AM  regarding patient history/labs/PE. Dr. Elonda Husky recommends admission based on worsening patient status along with betamethasone x2 and delivery in 24-48hrs, depending on medical course. -called and spoke with Dr. Valentino Saxon @0257AM  to recommend admission and recommendations per Dr. Elonda Husky above. Dr. Valentino Saxon agrees with plan for admission and requests MAU provider to enter basic admit orders along with betamethasone x2, fetal monitoring q shift. Orders entered. -on reassessment, pt reports HA has completely resolved -admit to [facility] -care transferred to Dr. Valentino Saxon @0310AM   Orders Placed This Encounter  Procedures  . US Abdomen Limited RUQ    Standing Status:   Standing    Number of Occurrences:   1    Order Specific Question:   Symptom/Reason for Exam  Answer:   RUQ pain [251471]  . Comprehensive metabolic panel    Standing Status:   Standing    Number of Occurrences:   1  . Protein / creatinine ratio, urine    Standing Status:   Standing    Number of Occurrences:   1  . CBC with Differential/Platelet    Standing Status:   Standing    Number of Occurrences:   1  . Notify physician (specify)    Standing Status:   Standing    Number of Occurrences:   20    Order Specific Question:   Notify Physician    Answer:   for pulse less than 60 or greater than 120    Order Specific Question:   Notify Physician    Answer:   for respiratory rate less than 12 or greater than 28    Order Specific Question:   Notify Physician    Answer:   for temperature greater than 100.4    Order Specific Question:   Notify Physician    Answer:   for urinary output less than 30 ml/hr    Order Specific Question:   Notify  Physician    Answer:   for systolic BP less than 80 or greater than 140    Order Specific Question:   Notify Physician    Answer:   for diastolic BP less than 40 or greater than 90  . Vital signs    While awake, respect sleep.    Standing Status:   Standing    Number of Occurrences:   1  . Defer vaginal exam for vaginal bleeding or PROM <37 weeks    Standing Status:   Standing    Number of Occurrences:   1  . Initiate Oral Care Protocol    Standing Status:   Standing    Number of Occurrences:   1  . Initiate Carrier Fluid Protocol    Standing Status:   Standing    Number of Occurrences:   1  . SCDs    Standing Status:   Standing    Number of Occurrences:   1    Order Specific Question:   Laterality    Answer:   Bilateral  . Fetal monitoring every shift x 30 minutes    Standing Status:   Standing    Number of Occurrences:   1  . Full code    Standing Status:   Standing    Number of Occurrences:   1  . Type and screen [facility]    [facility]     Standing Status:   Standing    Number of Occurrences:   1  . Admit to Inpatient (patient's expected length of stay will be greater than 2 midnights or inpatient only procedure)    Standing Status:   Standing    Number of Occurrences:   1    Order Specific Question:   Hospital Area    Answer:   [facility] [[id number]]    Order Specific Question:   Level of Care    Answer:   Antepartum [20]    Order Specific Question:   Covid Evaluation    Answer:   Asymptomatic Screening Protocol (No Symptoms)    Order Specific Question:   Diagnosis    Answer:   Preeclampsia, severe, third trimester [195093]    Order Specific Question:   Admitting Physician    Answer:   Noland Fordyce [  [id number]]    Order Specific Question:   Attending Physician    Answer:   Noland Fordyce [[id number]]    Order Specific Question:   Estimated length of stay    Answer:   past midnight tomorrow    Order Specific Question:    Certification:    Answer:   I certify this patient will need inpatient services for at least 2 midnights    Order Specific Question:   PT Class (Do Not Modify)    Answer:   Inpatient [101]    Order Specific Question:   PT Acc Code (Do Not Modify)    Answer:   Private [1]   Meds ordered this encounter  Medications  . acetaminophen (TYLENOL) tablet 650 mg  . zolpidem (AMBIEN) tablet 5 mg  . docusate sodium (COLACE) capsule 100 mg  . calcium carbonate (TUMS - dosed in mg elemental calcium) chewable tablet 400 mg of elemental calcium  . prenatal multivitamin tablet 1 tablet  . betamethasone acetate-betamethasone sodium phosphate (CELESTONE) injection 12 mg   Assessment and Plan   -admit to [facility] for severe preeclampsia  Odie Sera Landin Tallon 08/09/2019, 3:14 AM

## 2019-08-08 NOTE — MAU Note (Signed)
Pt here with c/o right sided rib pain that radiates downward. Reports that she vomited 2 times. States she call MD on call and took 2 Tylenol, 2 Tums, and Benadryl, but pain in side is not getting better. Being followed closely for pre-e. Denies contractions, LOF, or vaginal bleeding. Reports good fetal movement.

## 2019-08-09 ENCOUNTER — Inpatient Hospital Stay (HOSPITAL_COMMUNITY): Payer: Managed Care, Other (non HMO)

## 2019-08-09 DIAGNOSIS — O1413 Severe pre-eclampsia, third trimester: Secondary | ICD-10-CM | POA: Diagnosis present

## 2019-08-09 LAB — COMPREHENSIVE METABOLIC PANEL
ALT: 15 U/L (ref 0–44)
AST: 20 U/L (ref 15–41)
Albumin: 2.3 g/dL — ABNORMAL LOW (ref 3.5–5.0)
Alkaline Phosphatase: 215 U/L — ABNORMAL HIGH (ref 38–126)
Anion gap: 12 (ref 5–15)
BUN: 20 mg/dL (ref 6–20)
CO2: 21 mmol/L — ABNORMAL LOW (ref 22–32)
Calcium: 8.9 mg/dL (ref 8.9–10.3)
Chloride: 103 mmol/L (ref 98–111)
Creatinine, Ser: 0.97 mg/dL (ref 0.44–1.00)
GFR calc Af Amer: >60 mL/min (ref >60)
GFR calc non Af Amer: >60 mL/min (ref >60)
Glucose, Bld: 92 mg/dL (ref 70–99)
Potassium: 4.6 mmol/L (ref 3.5–5.1)
Sodium: 136 mmol/L (ref 135–145)
Total Bilirubin: 0.9 mg/dL (ref 0.3–1.2)
Total Protein: 5.7 g/dL — ABNORMAL LOW (ref 6.5–8.1)

## 2019-08-09 LAB — CBC WITH DIFFERENTIAL/PLATELET
Abs Immature Granulocytes: 0.1 10*3/uL — ABNORMAL HIGH (ref 0.00–0.07)
Basophils Absolute: 0 10*3/uL (ref 0.0–0.1)
Basophils Relative: 0 %
Eosinophils Absolute: 0 10*3/uL (ref 0.0–0.5)
Eosinophils Relative: 0 %
HCT: 36.3 % (ref 36.0–46.0)
Hemoglobin: 12.4 g/dL (ref 12.0–15.0)
Immature Granulocytes: 1 %
Lymphocytes Relative: 15 %
Lymphs Abs: 1.8 10*3/uL (ref 0.7–4.0)
MCH: 31.2 pg (ref 26.0–34.0)
MCHC: 34.2 g/dL (ref 30.0–36.0)
MCV: 91.4 fL (ref 80.0–100.0)
Monocytes Absolute: 0.7 10*3/uL (ref 0.1–1.0)
Monocytes Relative: 6 %
Neutro Abs: 9.2 10*3/uL — ABNORMAL HIGH (ref 1.7–7.7)
Neutrophils Relative %: 78 %
Platelets: 149 10*3/uL — ABNORMAL LOW (ref 150–400)
RBC: 3.97 MIL/uL (ref 3.87–5.11)
RDW: 12.5 % (ref 11.5–15.5)
WBC: 11.9 10*3/uL — ABNORMAL HIGH (ref 4.0–10.5)
nRBC: 0 % (ref 0.0–0.2)

## 2019-08-09 LAB — PROTEIN / CREATININE RATIO, URINE
Creatinine, Urine: 178.45 mg/dL
Protein Creatinine Ratio: 8.3 mg/mg{Cre} — ABNORMAL HIGH (ref 0.00–0.15)
Total Protein, Urine: 1482 mg/dL

## 2019-08-09 LAB — ABO/RH: ABO/RH(D): A POS

## 2019-08-09 LAB — SARS CORONAVIRUS 2 BY RT PCR (HOSPITAL ORDER, PERFORMED IN ~~LOC~~ HOSPITAL LAB): SARS Coronavirus 2: NEGATIVE

## 2019-08-09 MED ORDER — BETAMETHASONE SOD PHOS & ACET 6 (3-3) MG/ML IJ SUSP
12.0000 mg | INTRAMUSCULAR | Status: DC
  Administered 2019-08-09: 04:00:00 12 mg via INTRAMUSCULAR
  Filled 2019-08-09 (×2): qty 2

## 2019-08-09 MED ORDER — FERROUS SULFATE 325 (65 FE) MG PO TABS: 325.0000 mg | ORAL_TABLET | Freq: Every day | ORAL | 3 refills | Status: AC

## 2019-08-09 MED ORDER — DOCUSATE SODIUM 100 MG PO CAPS: 100.0000 mg | ORAL_CAPSULE | Freq: Every day | ORAL | Status: DC

## 2019-08-09 MED ORDER — CALCIUM CARBONATE ANTACID 500 MG PO CHEW: 2.0000 | CHEWABLE_TABLET | ORAL | Status: DC | PRN

## 2019-08-09 MED ORDER — ACETAMINOPHEN 325 MG PO TABS: 650.0000 mg | ORAL_TABLET | ORAL | Status: DC | PRN

## 2019-08-09 MED ORDER — ZOLPIDEM TARTRATE 5 MG PO TABS: 5.0000 mg | ORAL_TABLET | Freq: Every evening | ORAL | Status: DC | PRN

## 2019-08-09 MED ORDER — DOCUSATE SODIUM 100 MG PO CAPS: 100.0000 mg | ORAL_CAPSULE | Freq: Every day | ORAL | 2 refills | Status: AC

## 2019-08-09 MED ORDER — PRENATAL MULTIVITAMIN CH: 1.0000 | ORAL_TABLET | Freq: Every day | ORAL | Status: DC

## 2019-08-09 NOTE — H&P (Signed)
Chief complaint: Headache and right side pain  History present illness: 28 year old G1 at 33 weeks and 5 days who presented to MAU last night with headache and side pain.  Patient had called on-call doctor earlier in the evening and noted mild headache.  At that time she had only taken 500 mg of Tylenol once in the morning and once in the later afternoon.  She was instructed to repeat her Tylenol at 1000 mg.  She did this shortly before arrival to MAU.  Her headache was a 2 out of 10 upon arrival and completely resolved during her MAU course.  Patient also noted right lower side pain and 3 episodes of emesis.  In MAU she had stable labs and a normal right upper quadrant ultrasound.  Of note patient is being followed for recent diagnosis of preeclampsia.  She also has di-ditwin pregnancy.  This a.m. patient reports no headache and reports feeling much better due to the "security of being admitted".  Patient has mild right lower quadrant tenderness.  No headache, no vision change, no right upper quadrant pain, no chest pain, no shortness of breath.  No fundal tenderness, no bleeding, no leakage of fluid, no emesis, good fetal movement x2.  Patient does endorse significant swelling.  Prenatal history: Di-ditwins, preeclampsia, not severe  Past Medical History:  Diagnosis Date  . Hypertension   . Medical history non-contributory   . Pregnancy induced hypertension     Past Surgical History:  Procedure Laterality Date  . BREAST LUMPECTOMY     right  . WISDOM TOOTH EXTRACTION      Physical exam: Vitals:   08/09/19 0245 08/09/19 0300 08/09/19 0346 08/09/19 0407  BP: (!) 153/101 (!) 146/91 (!) 151/98 133/85  Pulse: 82 70 81 75  Resp:    18  Temp:    98.5 F (36.9 C)  TempSrc:    Oral  SpO2:    99%  Weight:      Height:       General: Well-appearing, slightly anxious, no distress Cardiovascular: Regular rate and rhythm Pulmonary: Clear to auscultation bilaterally Abdomen: Skin tense and  distended from pregnant uterus, no right upper quadrant pain or masses, no tympany, appropriately tender, no fundal tenderness GU: Deferred Back: No costovertebral angle tenderness Lower extremity: 1+ DTR, 3+ edema, no cords, no warmth Toco: Irritability FH: A: 140s, positive accelerations, no decelerations, 10 beat variability.  B: 140s, positive accelerations, no decelerations, 10 beat variability  CBC Latest Ref Rng & Units 08/08/2019 08/07/2019  WBC 4.0 - 10.5 K/uL 11.9(H) 10.0  Hemoglobin 12.0 - 15.0 g/dL 59.1 63.8  Hematocrit 46.6 - 46.0 % 36.3 34.1(L)  Platelets 150 - 400 K/uL 149(L) 147(L)   CMP Latest Ref Rng & Units 08/08/2019 08/07/2019  Glucose 70 - 99 mg/dL 92 89  BUN 6 - 20 mg/dL 20 19  Creatinine 5.99 - 1.00 mg/dL 3.57 0.17  Sodium 793 - 145 mmol/L 136 134(L)  Potassium 3.5 - 5.1 mmol/L 4.6 4.2  Chloride 98 - 111 mmol/L 103 104  CO2 22 - 32 mmol/L 21(L) 20(L)  Calcium 8.9 - 10.3 mg/dL 8.9 9.0(Z)  Total Protein 6.5 - 8.1 g/dL 0.0(P) 5.3(L)  Total Bilirubin 0.3 - 1.2 mg/dL 0.9 0.7  Alkaline Phos 38 - 126 U/L 215(H) 204(H)  AST 15 - 41 U/L 20 22  ALT 0 - 44 U/L 15 17    Assessment plan: 28 year old G1 at 33 weeks and 5 days with di-ditwin pregnancy and preeclampsia, not severe.  Despite significant proteinuria blood pressures are remaining stable.  Labs are stable.  Reactive fetal testing x2.  Discussed with patient ability to remain in-house for continued close monitoring however given she lives very close, has a blood pressure cuff at home and is able to play close attention to her symptoms I have recommended outpatient management of preeclampsia to watch for development of severe symptoms.  Symptoms reviewed in detail and will plan twice weekly office visits with close follow-up tomorrow.  Patient has already received her full betamethasone course approximately 2 weeks ago.  I did approve betamethasone last night after the MAU provider told me the patient had not yet had  betamethasone.  Upon speaking to the patient this morning and talking with her outpatient physician, patient has already completed her betamethasone.  I discussed with patient she did not need the extra dose last night and I do not recommend completing the second dose in this second course.  -Anxiety.  We discussed her anxiety is a likely contributor to her intermittent elevated blood pressures.   -Start iron due to edema  -DC home  Ala Dach 08/09/2019 9:16 AM

## 2019-08-09 NOTE — Discharge Summary (Signed)
Patient ID: Jacqueline Parker MRN: [id number] DOB/AGE: 04/21/1991 28 y.o.  Admit date: 08/08/2019 Discharge date: 08/09/2019  Admission Diagnoses: 40 WKS, CTX  Discharge Diagnoses: 54 WKS, CTX  Preeclampsia, not severe       Discharged Condition: stable  Hospital Course: Patient admitted for concerns of preeclampsia.  She was given a single dose of betamethasone.  This was despite having had a series of betamethasone 2 weeks prior.  Over the course of the night, patient's headache resolved and her right-sided pain was more lower cramping.  Patient remained with reactive fetal testing x2, had normal right upper quadrant ultrasound, stable labs and elevated but not severe blood pressures.  Options of inpatient versus outpatient management and patient prefer the latter.  Consults: None and MFM  Treatments: None  Disposition: home   Allergies as of 08/09/2019      Reactions   Penicillins Hives      Medication List    TAKE these medications   acetaminophen 500 MG tablet Commonly known as: TYLENOL Take 1,000 mg by mouth every 6 (six) hours as needed.   Comfort Fit Maternity Supp Sm Misc 1 Units by Does not apply route daily as needed.   diphenhydrAMINE 25 MG tablet Commonly known as: BENADRYL Take 25 mg by mouth every 6 (six) hours as needed.   docusate sodium 100 MG capsule Commonly known as: COLACE Take 1 capsule (100 mg total) by mouth daily.   famotidine 20 MG tablet Commonly known as: PEPCID Take 20 mg by mouth 2 (two) times daily.   ferrous sulfate 325 (65 FE) MG tablet Commonly known as: FerrouSul Take 1 tablet (325 mg total) by mouth daily with breakfast.        Signed: Ala Dach, MD MD 08/09/2019, 9:20 AM

## 2019-08-11 ENCOUNTER — Encounter (HOSPITAL_COMMUNITY): Payer: Self-pay

## 2019-08-11 ENCOUNTER — Other Ambulatory Visit: Payer: Self-pay

## 2019-08-11 ENCOUNTER — Inpatient Hospital Stay (HOSPITAL_COMMUNITY)
Admission: AD | Admit: 2019-08-11 | Discharge: 2019-08-16 | DRG: 807 | Disposition: A | Discharge status: Home / Self Care | Payer: Managed Care, Other (non HMO) | Attending: Obstetrics and Gynecology | Admitting: Obstetrics and Gynecology

## 2019-08-11 DIAGNOSIS — O30043 Twin pregnancy, dichorionic/diamniotic, third trimester: Secondary | ICD-10-CM | POA: Diagnosis present

## 2019-08-11 DIAGNOSIS — O1205 Gestational edema, complicating the puerperium: Secondary | ICD-10-CM | POA: Diagnosis present

## 2019-08-11 DIAGNOSIS — O9902 Anemia complicating childbirth: Secondary | ICD-10-CM | POA: Diagnosis present

## 2019-08-11 DIAGNOSIS — Z20828 Contact with and (suspected) exposure to other viral communicable diseases: Secondary | ICD-10-CM | POA: Diagnosis present

## 2019-08-11 DIAGNOSIS — Z3A34 34 weeks gestation of pregnancy: Secondary | ICD-10-CM | POA: Diagnosis not present

## 2019-08-11 DIAGNOSIS — O42913 Preterm premature rupture of membranes, unspecified as to length of time between rupture and onset of labor, third trimester: Secondary | ICD-10-CM | POA: Diagnosis present

## 2019-08-11 DIAGNOSIS — D649 Anemia, unspecified: Secondary | ICD-10-CM | POA: Diagnosis present

## 2019-08-11 DIAGNOSIS — O1404 Mild to moderate pre-eclampsia, complicating childbirth: Principal | ICD-10-CM | POA: Diagnosis present

## 2019-08-11 DIAGNOSIS — O30049 Twin pregnancy, dichorionic/diamniotic, unspecified trimester: Secondary | ICD-10-CM

## 2019-08-11 DIAGNOSIS — O42919 Preterm premature rupture of membranes, unspecified as to length of time between rupture and onset of labor, unspecified trimester: Secondary | ICD-10-CM | POA: Diagnosis present

## 2019-08-11 LAB — PROTEIN / CREATININE RATIO, URINE
Creatinine, Urine: 140.28 mg/dL
Protein Creatinine Ratio: 4.41 mg/mg{Cre} — ABNORMAL HIGH (ref 0.00–0.15)
Total Protein, Urine: 618 mg/dL

## 2019-08-11 LAB — TYPE AND SCREEN
ABO/RH(D): A POS
ABO/RH(D): A POS
Antibody Screen: NEGATIVE
Antibody Screen: NEGATIVE

## 2019-08-11 LAB — POCT FERN TEST: POCT Fern Test: POSITIVE

## 2019-08-11 MED ORDER — LABETALOL HCL 5 MG/ML IV SOLN: 40.0000 mg | INTRAVENOUS | Status: DC | PRN

## 2019-08-11 MED ORDER — HYDRALAZINE HCL 20 MG/ML IJ SOLN: 10.0000 mg | INTRAMUSCULAR | Status: DC | PRN

## 2019-08-11 MED ORDER — LACTATED RINGERS IV SOLN
INTRAVENOUS | Status: DC
  Administered 2019-08-11 – 2019-08-12 (×3): via INTRAVENOUS

## 2019-08-11 MED ORDER — OXYCODONE-ACETAMINOPHEN 5-325 MG PO TABS: 1.0000 | ORAL_TABLET | ORAL | Status: DC | PRN

## 2019-08-11 MED ORDER — MAGNESIUM SULFATE BOLUS VIA INFUSION
4.0000 g | Freq: Once | INTRAVENOUS | Status: DC
  Filled 2019-08-11: qty 1000

## 2019-08-11 MED ORDER — OXYTOCIN 40 UNITS IN NORMAL SALINE INFUSION - SIMPLE MED
2.5000 [IU]/h | INTRAVENOUS | Status: DC
  Administered 2019-08-12: 2.5 [IU]/h via INTRAVENOUS
  Filled 2019-08-11: qty 1000

## 2019-08-11 MED ORDER — SOD CITRATE-CITRIC ACID 500-334 MG/5ML PO SOLN: 30.0000 mL | ORAL | Status: DC | PRN

## 2019-08-11 MED ORDER — LACTATED RINGERS IV SOLN: 500.0000 mL | INTRAVENOUS | Status: DC | PRN

## 2019-08-11 MED ORDER — OXYCODONE-ACETAMINOPHEN 5-325 MG PO TABS: 2.0000 | ORAL_TABLET | ORAL | Status: DC | PRN

## 2019-08-11 MED ORDER — LABETALOL HCL 5 MG/ML IV SOLN: 20.0000 mg | INTRAVENOUS | Status: DC | PRN

## 2019-08-11 MED ORDER — ACETAMINOPHEN 325 MG PO TABS: 650.0000 mg | ORAL_TABLET | ORAL | Status: DC | PRN

## 2019-08-11 MED ORDER — VANCOMYCIN HCL IN DEXTROSE 1-5 GM/200ML-% IV SOLN
1000.0000 mg | Freq: Two times a day (BID) | INTRAVENOUS | Status: DC
  Administered 2019-08-11: 1000 mg via INTRAVENOUS
  Filled 2019-08-11 (×2): qty 200

## 2019-08-11 MED ORDER — LABETALOL HCL 5 MG/ML IV SOLN: 80.0000 mg | INTRAVENOUS | Status: DC | PRN

## 2019-08-11 MED ORDER — OXYTOCIN BOLUS FROM INFUSION
500.0000 mL | Freq: Once | INTRAVENOUS | Status: AC
  Administered 2019-08-12: 500 mL via INTRAVENOUS

## 2019-08-11 MED ORDER — MAGNESIUM SULFATE 40 GM/1000ML IV SOLN: 2.0000 g/h | INTRAVENOUS | Status: DC

## 2019-08-11 MED ORDER — LIDOCAINE HCL (PF) 1 % IJ SOLN
30.0000 mL | INTRAMUSCULAR | Status: AC | PRN
  Administered 2019-08-12: 30 mL via SUBCUTANEOUS
  Filled 2019-08-11: qty 30

## 2019-08-11 MED ORDER — ONDANSETRON HCL 4 MG/2ML IJ SOLN: 4.0000 mg | Freq: Four times a day (QID) | INTRAMUSCULAR | Status: DC | PRN

## 2019-08-11 NOTE — MAU Provider Note (Signed)
Chief Complaint:  Rupture of Membranes   First Provider Initiated Contact with Patient 08/11/19 2025      HPI: Jacqueline Parker is a 28 y.o. G1P0 at 15w0dwho presents to maternity admissions reporting leaking of fluid. Several big gushes. Contractions mild. She reports good fetal movement x 2, denies vaginal bleeding, vaginal itching/burning, urinary symptoms, h/a, dizziness, n/v, diarrhea, constipation or fever/chills.  She denies headache, visual changes or RUQ abdominal pain.  Reports increased LE edema   Past Medical History: Past Medical History:  Diagnosis Date  . Hypertension   . Medical history non-contributory   . Pregnancy induced hypertension     Past obstetric history: OB History  Gravida Para Term Preterm AB Living  1            SAB TAB Ectopic Multiple Live Births               # Outcome Date GA Lbr Len/2nd Weight Sex Delivery Anes PTL Lv  1 Current             Past Surgical History: Past Surgical History:  Procedure Laterality Date  . BREAST LUMPECTOMY     right  . WISDOM TOOTH EXTRACTION      Family History: Family History  Problem Relation Age of Onset  . Cancer Mother        breast  . Diabetes Father        type 2    Social History: Social History   Tobacco Use  . Smoking status: Never Smoker  . Smokeless tobacco: Never Used  Substance Use Topics  . Alcohol use: Never    Frequency: Never  . Drug use: Never    Allergies:  Allergies  Allergen Reactions  . Penicillins Hives    Meds:  Medications Prior to Admission  Medication Sig Dispense Refill Last Dose  . acetaminophen (TYLENOL) 500 MG tablet Take 1,000 mg by mouth every 6 (six) hours as needed.   08/11/2019 at Unknown time  . docusate sodium (COLACE) 100 MG capsule Take 1 capsule (100 mg total) by mouth daily. 60 capsule 2 08/11/2019 at Unknown time  . ferrous sulfate (FERROUSUL) 325 (65 FE) MG tablet Take 1 tablet (325 mg total) by mouth daily with breakfast. 30 tablet 3 08/11/2019  at Unknown time  . diphenhydrAMINE (BENADRYL) 25 MG tablet Take 25 mg by mouth every 6 (six) hours as needed.     Clinical research associate Bandages & Supports (COMFORT FIT MATERNITY SUPP SM) MISC 1 Units by Does not apply route daily as needed. 1 each 0   . famotidine (PEPCID) 20 MG tablet Take 20 mg by mouth 2 (two) times daily.       I have reviewed patient's Past Medical Hx, Surgical Hx, Family Hx, Social Hx, medications and allergies.   ROS:  Review of Systems  Constitutional: Negative for chills and fever.  Respiratory: Negative for shortness of breath.   Cardiovascular: Positive for leg swelling.  Gastrointestinal: Negative for constipation, diarrhea and nausea.  Genitourinary: Positive for pelvic pain and vaginal discharge. Negative for vaginal bleeding.  Musculoskeletal: Negative for back pain.  Neurological: Negative for dizziness, weakness and headaches.   Other systems negative  Physical Exam   Patient Vitals for the past 24 hrs:  BP Temp Temp src Pulse Resp Height Weight  08/11/19 2015 (!) 146/93 - - 90 - - -  08/11/19 2000 (!) 148/93 - - 90 - - -  08/11/19 1945 (!) 155/89 - - 85 - - -  08/11/19 1939 - - - - - 5\' 4"  (1.626 m) 92.1 kg  08/11/19 1937 (!) 162/101 - - 90 - - -  08/11/19 1926 (!) 155/96 99 F (37.2 C) Oral 89 16 - -   Constitutional: Well-developed, well-nourished female in no acute distress.  Cardiovascular: normal rate and rhythm Respiratory: normal effort, clear to auscultation bilaterally GI: Abd soft, non-tender, gravid appropriate for gestational age.   No rebound or guarding. MS: Extremities nontender, 2+ edema, normal ROM Neurologic: Alert and oriented x 4. DTRs 2+ with no clonus GU: Neg CVAT.  PELVIC EXAM: + gross pooling per RN      FHT:  Baseline 140 , moderate variability, accelerations present, no decelerations  Both twins reactive Contractions: q 5 mins Irregular     Labs: Results for orders placed or performed during the hospital encounter of  08/11/19 (from the past 24 hour(s))  Fern Test     Status: None   Collection Time: 08/11/19  8:07 PM  Result Value Ref Range   POCT Fern Test Positive = ruptured amniotic membanes    --/--/A POS, A POS Performed at [facility], [place] [address] [place], [place] [address]  [id number])  Imaging:    MAU Course/MDM: I have ordered labs and EFM NST reviewed, reactive both twins Consult Dr Ronita Hipps with presentation, exam findings and test results.   Assessment: Twin IUP at [redacted]w[redacted]d PPROM Preeclampsia  Plan: Admit  Has had betamethasone MD to follow  Hansel Feinstein CNM, MSN Certified Nurse-Midwife 08/11/2019 8:25 PM

## 2019-08-11 NOTE — H&P (Signed)
Ryhanna Dunsmore is a 28 y.o. female presenting for SROM at 34 weeks. Vertex/vertex twins.Mild PEC.. OB History    Gravida  1   Para      Term      Preterm      AB      Living        SAB      TAB      Ectopic      Multiple      Live Births             Past Medical History:  Diagnosis Date  . Hypertension   . Medical history non-contributory   . Pregnancy induced hypertension    Past Surgical History:  Procedure Laterality Date  . BREAST LUMPECTOMY     right  . WISDOM TOOTH EXTRACTION     Family History: family history includes Cancer in her mother; Diabetes in her father. Social History:  reports that she has never smoked. She has never used smokeless tobacco. She reports that she does not drink alcohol or use drugs.     Maternal Diabetes: No Genetic Screening: Normal Maternal Ultrasounds/Referrals: Normal Fetal Ultrasounds or other Referrals:  None Maternal Substance Abuse:  No Significant Maternal Medications:  None Significant Maternal Lab Results:  None Other Comments:  None  Review of Systems  Constitutional: Negative.   Eyes: Negative for blurred vision and photophobia.  Cardiovascular: Negative for chest pain.  All other systems reviewed and are negative.  Maternal Medical History:  Reason for admission: Rupture of membranes.   Contractions: Onset was 1-2 hours ago.   Frequency: irregular.   Perceived severity is mild.    Fetal activity: Perceived fetal activity is normal.   Last perceived fetal movement was within the past hour.    Prenatal complications: PIH.   Prenatal Complications - Diabetes: none.      Blood pressure (!) 146/93, pulse 90, temperature 99 F (37.2 C), temperature source Oral, resp. rate 16, height 5\' 4"  (1.626 m), weight 92.1 kg. Maternal Exam:  Uterine Assessment: Contraction strength is mild.  Contraction frequency is irregular.   Abdomen: Fetal presentation: vertex  Introitus: Normal vulva. Normal vagina.   Ferning test: positive.  Nitrazine test: positive.  Pelvis: adequate for delivery.   Cervix: Cervix evaluated by digital exam.     Physical Exam  Constitutional: She is oriented to person, place, and time. She appears well-developed and well-nourished.  HENT:  Head: Normocephalic.  Neck: Normal range of motion. Neck supple.  Cardiovascular: Regular rhythm.  Respiratory: Effort normal and breath sounds normal.  GI: Soft. Bowel sounds are normal.  Genitourinary:    Vulva, vagina and uterus normal.   Musculoskeletal: Normal range of motion.  Neurological: She is alert and oriented to person, place, and time. She has normal reflexes.  Skin: Skin is warm and dry.  Psychiatric: She has a normal mood and affect.    Prenatal labs: ABO, Rh: --/--/A POS, A POS Performed at [facility], [place] [address], [place], [place] [address]  [phone]) Antibody: NEG (10/19 0331) Rubella: Immune (07/07 0000) RPR: Nonreactive (07/07 0000)  HBsAg:    HIV: Non-reactive (07/07 0000)  GBS:     Assessment/Plan: 34 wk DIDI twin IUP Vertex/vertex SROM PEC w/o severe features  Admit PIH labs GBS prophylaxis  Latoya Diskin J 08/11/2019, 8:58 PM

## 2019-08-11 NOTE — MAU Note (Signed)
Started leaking fluid at 6 pm- it was clear fluid.  Large amount at first then leaking small amounts constantly since then.  Babies are moving well.  Having back pains like back labor, started at 6 pm also.  No bleeding.

## 2019-08-12 ENCOUNTER — Inpatient Hospital Stay (HOSPITAL_COMMUNITY): Payer: Managed Care, Other (non HMO) | Admitting: Anesthesiology

## 2019-08-12 ENCOUNTER — Encounter (HOSPITAL_COMMUNITY): Payer: Self-pay | Admitting: *Deleted

## 2019-08-12 DIAGNOSIS — O30049 Twin pregnancy, dichorionic/diamniotic, unspecified trimester: Secondary | ICD-10-CM

## 2019-08-12 LAB — CBC
HCT: 37.7 % (ref 36.0–46.0)
Hemoglobin: 12.7 g/dL (ref 12.0–15.0)
MCH: 31 pg (ref 26.0–34.0)
MCHC: 33.7 g/dL (ref 30.0–36.0)
MCV: 92 fL (ref 80.0–100.0)
Platelets: 164 10*3/uL (ref 150–400)
RBC: 4.1 MIL/uL (ref 3.87–5.11)
RDW: 12.7 % (ref 11.5–15.5)
WBC: 14.6 10*3/uL — ABNORMAL HIGH (ref 4.0–10.5)
nRBC: 0.1 % (ref 0.0–0.2)

## 2019-08-12 LAB — RPR: RPR Ser Ql: NONREACTIVE

## 2019-08-12 MED ORDER — COCONUT OIL OIL
1.0000 "application " | TOPICAL_OIL | Status: DC | PRN
  Administered 2019-08-14: 1 via TOPICAL

## 2019-08-12 MED ORDER — SENNOSIDES-DOCUSATE SODIUM 8.6-50 MG PO TABS
2.0000 | ORAL_TABLET | ORAL | Status: DC
  Administered 2019-08-12 – 2019-08-15 (×4): 2 via ORAL
  Filled 2019-08-12 (×4): qty 2

## 2019-08-12 MED ORDER — LACTATED RINGERS IV SOLN
500.0000 mL | Freq: Once | INTRAVENOUS | Status: AC
  Administered 2019-08-12: 500 mL via INTRAVENOUS

## 2019-08-12 MED ORDER — SODIUM CHLORIDE (PF) 0.9 % IJ SOLN
INTRAMUSCULAR | Status: DC | PRN
  Administered 2019-08-12: 12 mL/h via EPIDURAL

## 2019-08-12 MED ORDER — ACETAMINOPHEN 325 MG PO TABS
650.0000 mg | ORAL_TABLET | ORAL | Status: DC | PRN
  Administered 2019-08-12: 650 mg via ORAL
  Filled 2019-08-12: qty 2

## 2019-08-12 MED ORDER — LACTATED RINGERS IV SOLN: 500.0000 mL | Freq: Once | INTRAVENOUS | Status: DC

## 2019-08-12 MED ORDER — LIDOCAINE HCL (PF) 1 % IJ SOLN
INTRAMUSCULAR | Status: DC | PRN
  Administered 2019-08-12 (×2): 4 mL via EPIDURAL

## 2019-08-12 MED ORDER — ONDANSETRON HCL 4 MG PO TABS: 4.0000 mg | ORAL_TABLET | ORAL | Status: DC | PRN

## 2019-08-12 MED ORDER — BENZOCAINE-MENTHOL 20-0.5 % EX AERO
1.0000 "application " | INHALATION_SPRAY | CUTANEOUS | Status: DC | PRN
  Administered 2019-08-12: 1 via TOPICAL
  Filled 2019-08-12: qty 56

## 2019-08-12 MED ORDER — DIBUCAINE (PERIANAL) 1 % EX OINT: 1.0000 "application " | TOPICAL_OINTMENT | CUTANEOUS | Status: DC | PRN

## 2019-08-12 MED ORDER — WITCH HAZEL-GLYCERIN EX PADS: 1.0000 "application " | MEDICATED_PAD | CUTANEOUS | Status: DC | PRN

## 2019-08-12 MED ORDER — TETANUS-DIPHTH-ACELL PERTUSSIS 5-2.5-18.5 LF-MCG/0.5 IM SUSP: 0.5000 mL | Freq: Once | INTRAMUSCULAR | Status: DC

## 2019-08-12 MED ORDER — OXYCODONE-ACETAMINOPHEN 5-325 MG PO TABS: 2.0000 | ORAL_TABLET | ORAL | Status: DC | PRN

## 2019-08-12 MED ORDER — DIPHENHYDRAMINE HCL 25 MG PO CAPS: 25.0000 mg | ORAL_CAPSULE | Freq: Four times a day (QID) | ORAL | Status: DC | PRN

## 2019-08-12 MED ORDER — IBUPROFEN 600 MG PO TABS
600.0000 mg | ORAL_TABLET | Freq: Four times a day (QID) | ORAL | Status: DC
  Administered 2019-08-12 – 2019-08-16 (×15): 600 mg via ORAL
  Filled 2019-08-12 (×16): qty 1

## 2019-08-12 MED ORDER — PHENYLEPHRINE 40 MCG/ML (10ML) SYRINGE FOR IV PUSH (FOR BLOOD PRESSURE SUPPORT): 80.0000 ug | PREFILLED_SYRINGE | INTRAVENOUS | Status: DC | PRN

## 2019-08-12 MED ORDER — TERBUTALINE SULFATE 1 MG/ML IJ SOLN: 0.2500 mg | Freq: Once | INTRAMUSCULAR | Status: DC | PRN

## 2019-08-12 MED ORDER — EPHEDRINE 5 MG/ML INJ: 10.0000 mg | INTRAVENOUS | Status: DC | PRN

## 2019-08-12 MED ORDER — FENTANYL-BUPIVACAINE-NACL 0.5-0.125-0.9 MG/250ML-% EP SOLN
12.0000 mL/h | EPIDURAL | Status: DC | PRN
  Filled 2019-08-12: qty 250

## 2019-08-12 MED ORDER — PRENATAL MULTIVITAMIN CH
1.0000 | ORAL_TABLET | Freq: Every day | ORAL | Status: DC
  Administered 2019-08-13 – 2019-08-16 (×4): 1 via ORAL
  Filled 2019-08-12 (×4): qty 1

## 2019-08-12 MED ORDER — ZOLPIDEM TARTRATE 5 MG PO TABS: 5.0000 mg | ORAL_TABLET | Freq: Every evening | ORAL | Status: DC | PRN

## 2019-08-12 MED ORDER — OXYTOCIN 40 UNITS IN NORMAL SALINE INFUSION - SIMPLE MED
1.0000 m[IU]/min | INTRAVENOUS | Status: DC
  Administered 2019-08-12: 1 m[IU]/min via INTRAVENOUS

## 2019-08-12 MED ORDER — SIMETHICONE 80 MG PO CHEW: 80.0000 mg | CHEWABLE_TABLET | ORAL | Status: DC | PRN

## 2019-08-12 MED ORDER — OXYCODONE-ACETAMINOPHEN 5-325 MG PO TABS: 1.0000 | ORAL_TABLET | ORAL | Status: DC | PRN

## 2019-08-12 MED ORDER — DIPHENHYDRAMINE HCL 50 MG/ML IJ SOLN: 12.5000 mg | INTRAMUSCULAR | Status: DC | PRN

## 2019-08-12 MED ORDER — ONDANSETRON HCL 4 MG/2ML IJ SOLN: 4.0000 mg | INTRAMUSCULAR | Status: DC | PRN

## 2019-08-12 NOTE — Anesthesia Preprocedure Evaluation (Signed)
Anesthesia Evaluation  Patient identified by MRN, date of birth, ID band Patient awake    Reviewed: Allergy & Precautions, Patient's Chart, lab work & pertinent test results  History of Anesthesia Complications Negative for: history of anesthetic complications  Airway Mallampati: II  TM Distance: >3 FB Neck ROM: Full    Dental no notable dental hx.    Pulmonary neg pulmonary ROS,    Pulmonary exam normal        Cardiovascular hypertension (gestational), Normal cardiovascular exam     Neuro/Psych negative neurological ROS  negative psych ROS   GI/Hepatic negative GI ROS, Neg liver ROS,   Endo/Other  negative endocrine ROS  Renal/GU negative Renal ROS  negative genitourinary   Musculoskeletal negative musculoskeletal ROS (+)   Abdominal   Peds  Hematology negative hematology ROS (+)   Anesthesia Other Findings Day of surgery medications reviewed with patient.  Reproductive/Obstetrics (+) Pregnancy Twin gestation                             Anesthesia Physical Anesthesia Plan  ASA: II  Anesthesia Plan: Epidural   Post-op Pain Management:    Induction:   PONV Risk Score and Plan: Treatment may vary due to age or medical condition  Airway Management Planned: Natural Airway  Additional Equipment:   Intra-op Plan:   Post-operative Plan:   Informed Consent: I have reviewed the patients History and Physical, chart, labs and discussed the procedure including the risks, benefits and alternatives for the proposed anesthesia with the patient or authorized representative who has indicated his/her understanding and acceptance.       Plan Discussed with:   Anesthesia Plan Comments:         Anesthesia Quick Evaluation

## 2019-08-12 NOTE — Progress Notes (Signed)
Patient screened out for psychosocial assessment since none of the following apply: °Psychosocial stressors documented in mother or baby's chart °Gestation less than 32 weeks °Code at delivery  °Infant with anomalies °Please contact the Clinical Social Worker if specific needs arise, by MOB's request, or if MOB scores greater than 9/yes to question 10 on Edinburgh Postpartum Depression Screen. ° °Liberti Appleton Boyd-Gilyard, MSW, LCSW °Clinical Social Work °[phone] °  °

## 2019-08-12 NOTE — Plan of Care (Signed)
completed

## 2019-08-12 NOTE — Progress Notes (Signed)
Notified by RN that Hepatitis B results were not scanned into Epic.  Pt. Was a transfer to [facility] at [redacted] weeks gestation.  I personally reviewed her scanned records from the [place] [facility] group and she had a Hepatitis B Surface Antigen drawn on 02/03/2019 which was negative.  Please don't hesitate to contact me if you have further questions.   Lars Pinks, CNM [facility]

## 2019-08-12 NOTE — Progress Notes (Signed)
This RN called Dr. Ronita Hipps about pt not receiving betamethasone and per MD order pt is not to receive medication at this time.

## 2019-08-12 NOTE — Lactation Note (Signed)
This note was copied from a baby's chart. Lactation Consultation Note  Patient Name: Jacqueline Parker ASNKN'L Date: 08/12/2019 Reason for consult: Initial assessment;Late-preterm 34-36.6wks;Multiple gestation;1st time breastfeeding;Primapara;NICU baby;Infant < 6lbs  P2 mother whose infant twins are now 59 hours old.  These are LPTIs at 34+1 weeks weighing , 6 lbs and in the NICU.  RN had set up the DEBP for mother.  Asked if mother needed any review or had questions about the pump and she felt comfortable with the directions provided.  Encouraged to pump every three hours and to save any EBM she obtains.  Colostrum containers provided and milk storage time reviewed.  Reviewed hand expression with mother and she was unable to express any colostrum at this time.  Suggested she continue practicing hand expression before/after pumping to help increase milk supply.  Mother verbalized understanding.  Mom made aware of O/P services, breastfeeding support groups, community resources, and our phone # for post-discharge questions. Encouraged mother to pump in the NICU as desired and showed her how to bring her pump parts with her when she visits.  Reassured her that, after discharge, she will be able to have [place] support as needed in the NICU.  Mother has a DEBP for home use.  Father present and supportive.     Maternal Data Formula Feeding for Exclusion: No Has patient been taught Hand Expression?: Yes Does the patient have breastfeeding experience prior to this delivery?: No  Feeding Feeding Type: Donor Breast Milk Nipple Type: Nfant Extra Slow Flow (gold)  LATCH Score                   Interventions    Lactation Tools Discussed/Used Pump Review: Setup, frequency, and cleaning(Mother had no questions about pump)   Consult Status Consult Status: Follow-up Date: 08/13/19 Follow-up type: In-patient    [name] 08/12/2019, 5:38 PM

## 2019-08-12 NOTE — Progress Notes (Signed)
Jacqueline Parker is a 28 y.o. G1P0 at [redacted]w[redacted]d by LMP admitted for active labor, rupture of membranes  Subjective: Uncomfortable  Objective: BP (!) 147/88   Pulse 90   Temp 97.8 F (36.6 C) (Oral)   Resp 18   Ht 5\' 4"  (1.626 m)   Wt 92.1 kg   BMI 34.84 kg/m  No intake/output data recorded. No intake/output data recorded.  FHT:  FHR: 145 bpm, variability: moderate,  accelerations:  Present,  decelerations:  Absent UC:   irregular, every 1-4 minutes SVE:   Dilation: 1.5 Effacement (%): 80 Station: -3 Exam by:: B McClam, RN   Labs: Lab Results  Component Value Date   WBC 11.9 (H) 08/08/2019   HGB 12.4 08/08/2019   HCT 36.3 08/08/2019   MCV 91.4 08/08/2019   PLT 149 (L) 08/08/2019    Assessment / Plan: Augmentation of labor, progressing well  DIDI twins PEC w/o severe features  Labor: Progressing normally Preeclampsia:  no signs or symptoms of toxicity, intake and ouput balanced and labs stable Fetal Wellbeing:  Category I Pain Control:  Labor support without medications I/D:  n/a Anticipated MOD:  guarded  Jacqueline Parker J 08/12/2019, 6:45 AM

## 2019-08-12 NOTE — Anesthesia Postprocedure Evaluation (Signed)
Anesthesia Post Note  Patient: Jacqueline Parker  Procedure(s) Performed: AN AD HOC LABOR EPIDURAL     Patient location during evaluation: Mother Baby Anesthesia Type: Epidural Level of consciousness: awake and alert and oriented Pain management: satisfactory to patient Vital Signs Assessment: post-procedure vital signs reviewed and stable Respiratory status: respiratory function stable Cardiovascular status: stable Postop Assessment: no headache, no backache, epidural receding, patient able to bend at knees, no signs of nausea or vomiting and adequate PO intake Anesthetic complications: no    Last Vitals:  Vitals:   08/12/19 1245 08/12/19 1340  BP: 134/88 132/87  Pulse: (!) 101 (!) 103  Resp: 16 18  Temp: 37.3 C 37.3 C  SpO2: 100% 98%    Last Pain:  Vitals:   08/12/19 1754  TempSrc:   PainSc: Asleep   Pain Goal: Patients Stated Pain Goal: 2 (08/12/19 1640)                 Robertine Kipper

## 2019-08-12 NOTE — Anesthesia Procedure Notes (Signed)
Epidural Patient location during procedure: OB Start time: 08/12/2019 8:24 AM End time: 08/12/2019 8:27 AM  Staffing Anesthesiologist: Brennan Jason, MD Performed: anesthesiologist   Preanesthetic Checklist Completed: patient identified, pre-op evaluation, timeout performed, IV checked, risks and benefits discussed and monitors and equipment checked  Epidural Patient position: sitting Prep: site prepped and draped and DuraPrep Patient monitoring: continuous pulse ox, blood pressure and heart rate Approach: midline Location: L3-L4 Injection technique: LOR air  Needle:  Needle type: Tuohy  Needle gauge: 17 G Needle length: 9 cm Needle insertion depth: 6 cm Catheter type: closed end flexible Catheter size: 19 Gauge Catheter at skin depth: 11 cm Test dose: negative and Other (1% lidocaine)  Assessment Events: blood not aspirated, injection not painful, no injection resistance, negative IV test and no paresthesia  Additional Notes Patient identified. Risks, benefits, and alternatives discussed with patient including but not limited to bleeding, infection, nerve damage, paralysis, failed block, incomplete pain control, headache, blood pressure changes, nausea, vomiting, reactions to medication, itching, and postpartum back pain. Confirmed with bedside nurse the patient's most recent platelet count. Confirmed with patient that they are not currently taking any anticoagulation, have any bleeding history, or any family history of bleeding disorders. Patient expressed understanding and wished to proceed. All questions were answered. Sterile technique was used throughout the entire procedure. Please see nursing notes for vital signs.   Crisp LOR on first pass. Test dose was given through epidural catheter and negative prior to continuing to dose epidural or start infusion. Warning signs of high block given to the patient including shortness of breath, tingling/numbness in hands, complete  motor block, or any concerning symptoms with instructions to call for help. Patient was given instructions on fall risk and not to get out of bed. All questions and concerns addressed with instructions to call with any issues or inadequate analgesia.  Reason for block:procedure for pain

## 2019-08-13 LAB — COMPREHENSIVE METABOLIC PANEL
ALT: 17 U/L (ref 0–44)
AST: 27 U/L (ref 15–41)
Albumin: 1.8 g/dL — ABNORMAL LOW (ref 3.5–5.0)
Alkaline Phosphatase: 175 U/L — ABNORMAL HIGH (ref 38–126)
Anion gap: 8 (ref 5–15)
BUN: 17 mg/dL (ref 6–20)
CO2: 22 mmol/L (ref 22–32)
Calcium: 8.9 mg/dL (ref 8.9–10.3)
Chloride: 108 mmol/L (ref 98–111)
Creatinine, Ser: 1.09 mg/dL — ABNORMAL HIGH (ref 0.44–1.00)
GFR calc Af Amer: >60 mL/min (ref >60)
GFR calc non Af Amer: >60 mL/min (ref >60)
Glucose, Bld: 84 mg/dL (ref 70–99)
Potassium: 4.2 mmol/L (ref 3.5–5.1)
Sodium: 138 mmol/L (ref 135–145)
Total Bilirubin: 0.6 mg/dL (ref 0.3–1.2)
Total Protein: 4.3 g/dL — ABNORMAL LOW (ref 6.5–8.1)

## 2019-08-13 LAB — CBC
HCT: 31.3 % — ABNORMAL LOW (ref 36.0–46.0)
Hemoglobin: 10.7 g/dL — ABNORMAL LOW (ref 12.0–15.0)
MCH: 31.8 pg (ref 26.0–34.0)
MCHC: 34.2 g/dL (ref 30.0–36.0)
MCV: 92.9 fL (ref 80.0–100.0)
Platelets: 142 10*3/uL — ABNORMAL LOW (ref 150–400)
RBC: 3.37 MIL/uL — ABNORMAL LOW (ref 3.87–5.11)
RDW: 12.8 % (ref 11.5–15.5)
WBC: 11.5 10*3/uL — ABNORMAL HIGH (ref 4.0–10.5)
nRBC: 0.3 % — ABNORMAL HIGH (ref 0.0–0.2)

## 2019-08-13 MED ORDER — HYDROCHLOROTHIAZIDE 12.5 MG PO CAPS
12.5000 mg | ORAL_CAPSULE | Freq: Every day | ORAL | Status: DC
  Administered 2019-08-13 – 2019-08-15 (×3): 12.5 mg via ORAL
  Filled 2019-08-13 (×3): qty 1

## 2019-08-13 MED ORDER — FERROUS SULFATE 325 (65 FE) MG PO TABS
325.0000 mg | ORAL_TABLET | Freq: Every day | ORAL | Status: DC
  Administered 2019-08-13: 325 mg via ORAL
  Filled 2019-08-13: qty 1

## 2019-08-13 NOTE — Progress Notes (Signed)
Post Partum Day 1 Twins, SVDs- Boys at 34 wks (A- SVD, B - VAVD) Preeclampsia by BP and Urine p/c, no neural symptoms. Didn't receive magnesium sulphate   Subjective: no complaints, up ad lib, voiding, tolerating PO and + flatus Reports LE swelling much reduced   Objective: Blood pressure 123/87, pulse 65, temperature 97.8 F (36.6 C), temperature source Oral, resp. rate 17, height 5\' 4"  (1.626 m), weight 92.1 kg, SpO2 97 %, unknown if currently breastfeeding.  Patient Vitals for the past 24 hrs:  BP Temp Temp src Pulse Resp SpO2  08/13/19 1500 132/89 97.8 F (36.6 C) Oral 78 18 -  08/13/19 0617 123/87 97.8 F (36.6 C) Oral 65 17 97 %  08/13/19 0216 (!) 136/93 - - 78 18 97 %  08/12/19 2256 (!) 134/99 (!) 97.5 F (36.4 C) Oral - 17 98 %  08/12/19 1825 137/87 99.4 F (37.4 C) Oral (!) 104 16 96 %    Physical Exam:  General: alert and cooperative Lochia: appropriate Lungs CTA  CV RRR Uterine Fundus: firm Incision: N/A DVT Evaluation: No evidence of DVT seen on physical exam. Swelling ++ tense  A(+) Rub Immune   CBC Latest Ref Rng & Units 08/13/2019 08/12/2019 08/08/2019  WBC 4.0 - 10.5 K/uL 11.5(H) 14.6(H) 11.9(H)  Hemoglobin 12.0 - 15.0 g/dL 10.7(L) 12.7 12.4  Hematocrit 36.0 - 46.0 % 31.3(L) 37.7 36.3  Platelets 150 - 400 K/uL 142(L) 164 149(L)   CMP Latest Ref Rng & Units 08/13/2019 08/08/2019 08/07/2019  Glucose 70 - 99 mg/dL 84 92 89  BUN 6 - 20 mg/dL 17 20 19   Creatinine 0.44 - 1.00 mg/dL 1.09(H) 0.97 0.95  Sodium 135 - 145 mmol/L 138 136 134(L)  Potassium 3.5 - 5.1 mmol/L 4.2 4.6 4.2  Chloride 98 - 111 mmol/L 108 103 104  CO2 22 - 32 mmol/L 22 21(L) 20(L)  Calcium 8.9 - 10.3 mg/dL 8.9 8.9 8.3(L)  Total Protein 6.5 - 8.1 g/dL 4.3(L) 5.7(L) 5.3(L)  Total Bilirubin 0.3 - 1.2 mg/dL 0.6 0.9 0.7  Alkaline Phos 38 - 126 U/L 175(H) 215(H) 204(H)  AST 15 - 41 U/L 27 20 22   ALT 0 - 44 U/L 17 15 17     Assessment/Plan: Preterm delivery of twin boys, in NICU, stable.   Preeclampsia- stable, no meds, no symptoms, so no magnesium-- plan low dose HTCZ at 12.5 mg and daily weights due to severe edema, repeat labs tomo Anemia- PO iron Routine PP care, Lactation consult    LOS: 2 days   Elveria Royals 08/13/2019, 11:14 AM

## 2019-08-14 LAB — CBC
HCT: 31.5 % — ABNORMAL LOW (ref 36.0–46.0)
Hemoglobin: 10.7 g/dL — ABNORMAL LOW (ref 12.0–15.0)
MCH: 31.3 pg (ref 26.0–34.0)
MCHC: 34 g/dL (ref 30.0–36.0)
MCV: 92.1 fL (ref 80.0–100.0)
Platelets: 130 10*3/uL — ABNORMAL LOW (ref 150–400)
RBC: 3.42 MIL/uL — ABNORMAL LOW (ref 3.87–5.11)
RDW: 13.1 % (ref 11.5–15.5)
WBC: 8.9 10*3/uL (ref 4.0–10.5)
nRBC: 0 % (ref 0.0–0.2)

## 2019-08-14 LAB — COMPREHENSIVE METABOLIC PANEL
ALT: 17 U/L (ref 0–44)
AST: 23 U/L (ref 15–41)
Albumin: 1.8 g/dL — ABNORMAL LOW (ref 3.5–5.0)
Alkaline Phosphatase: 159 U/L — ABNORMAL HIGH (ref 38–126)
Anion gap: 9 (ref 5–15)
BUN: 15 mg/dL (ref 6–20)
CO2: 23 mmol/L (ref 22–32)
Calcium: 8.5 mg/dL — ABNORMAL LOW (ref 8.9–10.3)
Chloride: 108 mmol/L (ref 98–111)
Creatinine, Ser: 0.92 mg/dL (ref 0.44–1.00)
GFR calc Af Amer: >60 mL/min (ref >60)
GFR calc non Af Amer: >60 mL/min (ref >60)
Glucose, Bld: 76 mg/dL (ref 70–99)
Potassium: 4.2 mmol/L (ref 3.5–5.1)
Sodium: 140 mmol/L (ref 135–145)
Total Bilirubin: 0.4 mg/dL (ref 0.3–1.2)
Total Protein: 4.4 g/dL — ABNORMAL LOW (ref 6.5–8.1)

## 2019-08-14 NOTE — Lactation Note (Signed)
This note was copied from a baby's chart. Lactation Consultation Note  Patient Name: Jacqueline Parker MBWGY'K Date: 08/14/2019 Reason for consult: Follow-up assessment;1st time breastfeeding;Primapara;NICU baby;Mother's request;Late-preterm 34-36.6wks;Infant < 6lbs;Infant weight loss;Multiple gestation  Visited again with mom, this time in the NICU for a feeding assist. LC started with breast massage and hand expression, mom getting excited again because she saw droplets of colostrum, her right breast is the one yielding the bigger droplets, but she's still not getting "big drops" just droplets.  Baby A  LC took Baby Jacqueline Parker to the left breast in cross cradle position and he was able to latch very briefly for a few seconds, but would not continue to suck, even with stimulation. Explained to mom that this is part of normal LPI behavior and not toe get discouraged. Baby fell asleep at the breast, LC showed mom how to break the seal with her pinky finger. RN had to reposition baby's feeding tube after he fed at the breast because baby pulled it out and wouldn't let go, LC and dad had to get tube out of baby's hand first.  Baby B   Baby Jacqueline Parker went to breast next in football position on the right breast. He behaved very close as his sibling, open a big wide open mouth, did a few sucks and fell asleep at the breast. No audible swallows on either feeding but parents were proud of their babies because at least "they latched"; mom got really emotional and shed a few tears, but she told LC they were "happy tears" because she wasn't expecting her babies to suck at all at the breast today. Praised her for her efforts. This time mom was able to breast the latch seal on her own.  Feeding plan:  1. Encouraged mom to keep pumping every 3 hours, at least 8 pumping sessions/24 hours 2. Mom will do breast massage, hand expression and will also use coconut oil prior pumping 3. She'll start wearing her shells today  in between pumping sessions 4. She'll continue doing STS/BF with babies if OK'd by NICU staff and call PRN for feeding assists  Parents reported all questions and concerns were answered, they're both aware of [place] OP services and will call PRN.   Maternal Data    Feeding Feeding Type: Breast Fed  LATCH Score Latch: Too sleepy or reluctant, no latch achieved, no sucking elicited.  Audible Swallowing: None  Type of Nipple: Everted at rest and after stimulation(very short shafted, has breast shells)  Comfort (Breast/Nipple): Soft / non-tender  Hold (Positioning): Assistance needed to correctly position infant at breast and maintain latch.  LATCH Score: 5  Interventions Interventions: Breast feeding basics reviewed;Assisted with latch;Skin to skin;Breast massage;Hand express;Reverse pressure;Breast compression;Adjust position;Support pillows;Position options;Shells  Lactation Tools Discussed/Used     Consult Status Consult Status: PRN Follow-up type: In-patient    Jacqueline Parker 08/14/2019, 3:53 PM

## 2019-08-14 NOTE — Lactation Note (Signed)
This note was copied from a baby's chart. Lactation Consultation Note  Patient Name: Jacqueline Parker Sachdev OEUMP'N Date: 08/14/2019 Reason for consult: Follow-up assessment;NICU baby;Late-preterm 34-36.6wks;Infant < 6lbs;Infant weight loss;Primapara;1st time breastfeeding  Visited with mom of 36 hours old LPI NICU twins; babies are currently on donor milk but mom has been pumping diligently every 3 hours but has not seen more that one droplet of colostrum on her last pumping session. She reported moderate breast changes during the pregnancy, she stated that her breast got slightly bigger but no changes in pigmentation.   Mom complained of soreness, noticed that her left breast has a small scab around the nipple, mom has been wearing flanges # 24 without any lubricating agent (due to the lack of colostrum yet). Mom understands that pumping early on is mainly for breast stimulation and she'll continue doing it, but will try flanges # 27 and RN will be bringing her some coconut oil.  LC assisted mom with hand expression, starting with breast massage, and she was able to get small needle size droplets of colostrum from the right breast and just glistering from the left one. Per mom Hx she had a lumpectomy from the right breast but she stated it was very small, right breast looks actually much larger than the left one. Parents are willing to latch babies to breast, offered feeding assist for today but explained expectations of "feeding" this early, they're aware that is more like "practice at the breast".  Noticed that mom still had areolar edema; set her up with breast shells, instructions, cleaning and storage were reviewed. She brought a nursing bra to the hospital and put them on during [facility] consultation. Parents requested a feeding assist at 3 pm today, LC will be working with them in the NICU the next time.   Feeding plan:  1. Encouraged mom to keep pumping every 3 hours, at least 8 pumping sessions/24  hours 2. Mom will do breast massage, hand expression and will also use coconut oil prior pumping 3. She'll start wearing her shells today in between pumping sessions 4. She'll continue doing STS/BF with babies if OK'd by NICU staff  Parents reported all questions and concerns were answered, they're both aware of [place] OP services and will call PRN.  Maternal Data    Feeding Feeding Type: Donor Breast Milk  LATCH Score                   Interventions Interventions: Breast feeding basics reviewed;Breast massage;Hand express;Breast compression;Coconut oil  Lactation Tools Discussed/Used Tools: Shells;Coconut oil Shell Type: Inverted   Consult Status Consult Status: Follow-up Date: 08/14/19 Follow-up type: In-patient    Nawaf Strange Francene Boyers 08/14/2019, 11:54 AM

## 2019-08-14 NOTE — Progress Notes (Signed)
PPD 2 SVD twin A and VAVD twin B with 2nd degree repair  Not in room for rounds    Objective:   VS: BP 131/86 (BP Location: Left Arm)   Pulse 71   Temp 98 F (36.7 C) (Oral)   Resp 18   Ht 5\' 4"  (1.626 m)   Wt 83.9 kg   SpO2 100%   Breastfeeding Unknown   BMI 31.75 kg/m    BP: 131/86 - 132/89 - 123/87 - 136/93  LABS:  Recent Labs    08/13/19 0541 08/14/19 0528  WBC 11.5* 8.9  HGB 10.7* 10.7*  PLT 142* 130*  Results for [name], [name] (MRN [id number]) as of 08/14/2019 09:58  Ref. Range 08/14/2019 05:28  Creatinine Latest Ref Range: 0.44 - 1.00 mg/dL 0.92  Calcium Latest Ref Range: 8.9 - 10.3 mg/dL 8.5 (L)  Anion gap Latest Ref Range: 5 - 15  9  Alkaline Phosphatase Latest Ref Range: 38 - 126 U/L 159 (H)  Albumin Latest Ref Range: 3.5 - 5.0 g/dL 1.8 (L)  AST Latest Ref Range: 15 - 41 U/L 23  ALT Latest Ref Range: 0 - 44 U/L 17   Blood type: --/--/A POS (10/21 2035) Rubella: Immune (07/07 0000)         Assessment / Plan:  PPD # 2 SVD twin A and VAVD twin B with 2nd degree repair Preeclampsia - mild without magnesium sulfate prophylaxis  doing well - stable status routine post partum orders re-visit this PM to determine discharge plan if BP remain stable  Artelia Laroche CNM, MSN, [place] 08/14/2019, 10:01 AM

## 2019-08-14 NOTE — Progress Notes (Signed)
Re-visit - back in room from NICU  Subjective:   reports feeling ok - little "crampy" but no pain In NICU all day - no chair to elevate feet in NICU room tolerating po intake without nausea or vomiting vaginal bleeding reported as light pain well-controlled  up ad lib / ambulatory in halls / voiding QS without urinary concerns  Newborns Breastfeeding with good latch on both twins / in NICU  Objective:   VS: BP 136/87 (BP Location: Left Arm)   Pulse 71   Temp 98 F (36.7 C) (Oral)   Resp 18   Ht 5\' 4"  (1.626 m)   Wt 83.9 kg   SpO2 98%   Breastfeeding Unknown   BMI 31.75 kg/m   I&O: Intake/Output      10/24 0701 - 10/25 0700   P.O. 960   I.V. (mL/kg)    Other    Total Intake(mL/kg) 960 (11.4)   Net +960       Urine Occurrence 4 x   Stool Occurrence 1 x    Physical Exam: alert and oriented X3 without any distress or pain abdomen soft, non-tender, non-distended  uterine fundus firm, non-tender, Ueven perineum with small edema lochia light to scant extremities: 2+ pedal edema, no calf pain or tenderness, SCD back on while in bed resting   Assessment / Plan:  PPD # 2 SVD / VAVD twins / preeclampsia Dependent edema   doing well - stable status routine post partum orders continue HCTZ - increase water intake and elevate feet / try to avoid prolonged position with feet dependent [place], MSN, [facility] 08/14/2019, 8:28 PM

## 2019-08-14 NOTE — Lactation Note (Addendum)
This note was copied from a baby's chart. Lactation Consultation Note  Patient Name: Jacqueline Parker Guardia Today's Date: 08/14/2019   Lactation visit attempted to Mom's room at 0810, but parents not present. Dois Davenport, NICU RN then called & asked me to assist with potential breast feeding @ 0900 in NICU room.  Mom noted to be taking HCTZ 12.5mg  qd (L2).  Matthias Hughs [facility] 08/14/2019, 8:13 AM   [phone]: I notified NICU RN that I would not be able to be present for the 0900 feeding, but lactation would follow-up with Mom to answer her questions about pumping.  Mom's breast hx is significant for lumpectomy from her R breast.   Elinor Dodge, RN, IBCLC

## 2019-08-15 MED ORDER — NIFEDIPINE ER OSMOTIC RELEASE 30 MG PO TB24
30.0000 mg | ORAL_TABLET | Freq: Every day | ORAL | Status: DC
  Administered 2019-08-15 – 2019-08-16 (×2): 30 mg via ORAL
  Filled 2019-08-15 (×2): qty 1

## 2019-08-15 NOTE — Lactation Note (Signed)
This note was copied from a baby's chart. Lactation Consultation Note  Patient Name: Jacqueline Parker Today's Date: 08/15/2019   I visited parents in their infants' NICU room to talk with Mom about pumping & hand expression. I showed her a different hand expression technique & Mom was super-pleased to see the large drops/short stream that resulted. Mom has been using the size 27 flanges, but her nipples may need size 30 flanges. Mom & Dad affirmed that the size 27 flanges did not likely give her nipples enough room, so I provided 2 size 30 flanges. I asked Mom to let me know if the size 30 flanges didn't help.  Mom has a Development worker, international aid for personal use. The Motif flange set includes sizes #28 & # 32. I offered to show parents the hand pump included in the Medela DEBP kit once they return to the postpartum room on the 4th floor.   Parents are washing their pump parts after use. I informed them that it is also recommended to sanitize their pump parts once/day. Dad feels that he can take the pump parts home to sanitize them, as they do not live far away.    Matthias Hughs [facility] 08/15/2019, 10:06 AM

## 2019-08-15 NOTE — Progress Notes (Signed)
Postpartum day 3 status post spontaneous vaginal delivery at 34 weeks, di-ditwins, preeclampsia  Patient notes no headache, no vision change, no right upper quadrant pain.  Patient states feels well.  Minimal lochia.  Bowel movement yesterday.  Ambulating without complications.  Voiding normally.  Pain controlled.  Normal diet.  Patient has been back and forth to the NICU visiting babies.  Objective:  Vitals:   08/14/19 1415 08/14/19 2015 08/15/19 0524 08/15/19 1100  BP: 136/87 (!) 145/90 140/84   Pulse:  74 77   Resp:  18 19   Temp:  97.8 F (36.6 C) 98.9 F (37.2 C)   TempSrc:  Oral Oral   SpO2: 98% 99% 98%   Weight:    82 kg  Height:       General: Well-appearing, no distress Cardiovascular: Regular rate and rhythm, no murmur rub or gallop Pulmonary: Clear to auscultation bilaterally Abdomen: No right upper quadrant pain, fundus nontender at umbilicus, no rebound, no distention, no guarding GU: Appropriate lochia Lower extremity: Trace edema, 1+ DTR,  CMP Latest Ref Rng & Units 08/14/2019 08/13/2019 08/08/2019  Glucose 70 - 99 mg/dL 76 84 92  BUN 6 - 20 mg/dL 15 17 20   Creatinine 0.44 - 1.00 mg/dL 0.92 1.09(H) 0.97  Sodium 135 - 145 mmol/L 140 138 136  Potassium 3.5 - 5.1 mmol/L 4.2 4.2 4.6  Chloride 98 - 111 mmol/L 108 108 103  CO2 22 - 32 mmol/L 23 22 21(L)  Calcium 8.9 - 10.3 mg/dL 8.5(L) 8.9 8.9  Total Protein 6.5 - 8.1 g/dL 4.4(L) 4.3(L) 5.7(L)  Total Bilirubin 0.3 - 1.2 mg/dL 0.4 0.6 0.9  Alkaline Phos 38 - 126 U/L 159(H) 175(H) 215(H)  AST 15 - 41 U/L 23 27 20   ALT 0 - 44 U/L 17 17 15     CBC Latest Ref Rng & Units 08/14/2019 08/13/2019 08/12/2019  WBC 4.0 - 10.5 K/uL 8.9 11.5(H) 14.6(H)  Hemoglobin 12.0 - 15.0 g/dL 10.7(L) 10.7(L) 12.7  Hematocrit 36.0 - 46.0 % 31.5(L) 31.3(L) 37.7  Platelets 150 - 400 K/uL 130(L) 142(L) 164   Assessment and plan: Postpartum day 3 status post vaginal delivery for twins at 34 weeks due to preeclampsia. Postpartum: Overall  doing well.  Continue routine care Preeclampsia.  Creatinine improving.  Liver function never with abnormality.  Platelets still on a decline but not in a worrisome range.  We will continue to watch and repeat CBC tomorrow.  Blood pressures remain elevated for a patient that did not have pre-existing hypertension.  Will start Procardia 30 megs XL and follow blood pressures. Anticipate discharge home tomorrow. Babies in [place] 08/15/2019 2:23 PM

## 2019-08-16 ENCOUNTER — Ambulatory Visit: Payer: Self-pay

## 2019-08-16 LAB — CBC WITH DIFFERENTIAL/PLATELET
Abs Immature Granulocytes: 0.17 10*3/uL — ABNORMAL HIGH (ref 0.00–0.07)
Basophils Absolute: 0 10*3/uL (ref 0.0–0.1)
Basophils Relative: 0 %
Eosinophils Absolute: 0.2 10*3/uL (ref 0.0–0.5)
Eosinophils Relative: 2 %
HCT: 31.2 % — ABNORMAL LOW (ref 36.0–46.0)
Hemoglobin: 10.6 g/dL — ABNORMAL LOW (ref 12.0–15.0)
Immature Granulocytes: 2 %
Lymphocytes Relative: 27 %
Lymphs Abs: 2.2 10*3/uL (ref 0.7–4.0)
MCH: 31.5 pg (ref 26.0–34.0)
MCHC: 34 g/dL (ref 30.0–36.0)
MCV: 92.9 fL (ref 80.0–100.0)
Monocytes Absolute: 0.5 10*3/uL (ref 0.1–1.0)
Monocytes Relative: 6 %
Neutro Abs: 5 10*3/uL (ref 1.7–7.7)
Neutrophils Relative %: 63 %
Platelets: 125 10*3/uL — ABNORMAL LOW (ref 150–400)
RBC: 3.36 MIL/uL — ABNORMAL LOW (ref 3.87–5.11)
RDW: 13.1 % (ref 11.5–15.5)
WBC: 8 10*3/uL (ref 4.0–10.5)
nRBC: 0 % (ref 0.0–0.2)

## 2019-08-16 LAB — SURGICAL PATHOLOGY

## 2019-08-16 MED ORDER — IBUPROFEN 600 MG PO TABS: 600.0000 mg | ORAL_TABLET | Freq: Four times a day (QID) | ORAL | 0 refills | Status: AC

## 2019-08-16 MED ORDER — HYDROCHLOROTHIAZIDE 25 MG PO TABS
25.0000 mg | ORAL_TABLET | Freq: Every day | ORAL | Status: DC
  Administered 2019-08-16: 25 mg via ORAL
  Filled 2019-08-16: qty 1

## 2019-08-16 MED ORDER — HYDROCHLOROTHIAZIDE 25 MG PO TABS: 25.0000 mg | ORAL_TABLET | Freq: Every day | ORAL | 0 refills | Status: AC

## 2019-08-16 MED ORDER — NIFEDIPINE ER 30 MG PO TB24: 30.0000 mg | ORAL_TABLET | Freq: Every day | ORAL | 0 refills | Status: AC

## 2019-08-16 MED ORDER — MAGNESIUM OXIDE -MG SUPPLEMENT 420 (252 MG) MG PO TABS: 1.0000 | ORAL_TABLET | Freq: Every day | ORAL | 0 refills | Status: AC

## 2019-08-16 NOTE — Progress Notes (Signed)
PPD 4 SVD-twins / mild preeclampsia  Subjective:   reports feeling ok - no PIH symptoms / just swollen feet tolerating po intake without nausea or vomiting vaginal bleeding reported as light pain controlled  up ad lib /voiding QS without urinary concerns  Newborns in NICU Mom continuous in NICU since ~0400 sitting in chair with feet dependent  Objective:   VS: BP 130/80   Pulse 88   Temp 98.1 F (36.7 C) (Oral)   Resp 18   Ht 5\' 4"  (1.626 m)   Wt 82 kg   SpO2 100%   Breastfeeding Unknown   BMI 31.03 kg/m   LABS:  Recent Labs    08/14/19 0528 08/16/19 0552  WBC 8.9 8.0  HGB 10.7* 10.6*  PLT 130* 125*   Blood type: --/--/A POS (10/21 2035) Rubella: Immune (07/07 0000)        Physical Exam: alert and oriented X3 without any distress or pain abdomen soft, non-tender, non-distended  uterine fundus firm, non-tender, Ueven extremities: 3+ edema, no calf pain or tenderness   Assessment / Plan:  PPD # 4 SVD twins / mild preeeclampsia  dependent edema - increased HCTZ this am (not taken yet) doing well - stable status  routine post partum orders  Re-emphasized need for rest and elevation of feet with PEC  HCTZ 25mg  x next 3 days - need adequate water intake / low sodium intake OV this week at [facility] for recheck BP and edema DC home   [place], MSN, [facility] 08/16/2019, 11:19 AM

## 2019-08-16 NOTE — Lactation Note (Signed)
This note was copied from a baby's chart. Lactation Consultation Note  Patient Name: KIMBLEY SPRAGUE Heyne TKPTW'S Date: 08/16/2019 Reason for consult: Follow-up assessment;Mother's request;Difficult latch;1st time breastfeeding;NICU baby;Late-preterm 34-36.6wks;Infant < 6lbs  I followed up with Ms. Nield upon request. She wanted to work on latching [name] at this feeding. We attempted to latch him in football hold on the right breast. I showed Ms. Gathers how to hand express to allow him to lick the breast, and then we stimulated his filtrum with her nipple. This worked well and he opened his mouth. I showed her how to make a breast "sandwich" by making a U hold. I educated Ms. Riveron and her support person.  Arlo briefly latched the breast and gave a few sucks and then stopped and held the nipple. Her nipples are slightly larger than baby's mouth, but he did open fairly wide to the base. He was a bit uncoordinated at the breast, but mom and dad were pleased to see some practice sucks and lick and learn.  We reviewed LPT behaviors, and allowed baby to rest at the breast STS while being gavage fed.  I then palpated Ms. Kube' left breast upon request and felt a larger area of congestion on the outside quadrant. I did not note heat in the area or redness. She states this began today. I recommended cold therapy several times a day on the affected area with warm moist heat applied just prior to pumping. I also showed her how to gently massage the breast without causing additional inflammation, and I recommended that tonight she life the breast off of her chest wall to allow for lymphatic drainage.  Ms. Furniss does not have a pumping bra. I recommended the lactamed breast pumping harness to allow for hands-on pumping.  I viewed a video of Ms. Philipson pumping with a size 30 flange. It appeared that her nipple was rubbing the inside of the flanges, and I provided her with size 36 to try.  Ms. Spohr will call  as needed for assistance. No further questions at this time.   Maternal Data Has patient been taught Hand Expression?: Yes Does the patient have breastfeeding experience prior to this delivery?: No  Feeding Feeding Type: Donor Breast Milk Nipple Type: Nfant Extra Slow Flow (gold)  LATCH Score Latch: Repeated attempts needed to sustain latch, nipple held in mouth throughout feeding, stimulation needed to elicit sucking reflex.  Audible Swallowing: None  Type of Nipple: Everted at rest and after stimulation  Comfort (Breast/Nipple): Soft / non-tender  Hold (Positioning): Assistance needed to correctly position infant at breast and maintain latch.  LATCH Score: 6  Interventions Interventions: Breast feeding basics reviewed;Assisted with latch;Skin to skin;Breast massage;Hand express;Reverse pressure;Support pillows;Adjust position  Lactation Tools Discussed/Used Tools: Flanges Flange Size: 36 Pump Review: Setup, frequency, and cleaning   Consult Status Consult Status: Follow-up Follow-up type: Call as needed    Lenore Manner 08/16/2019, 6:49 PM

## 2019-08-16 NOTE — Lactation Note (Signed)
This note was copied from a baby's chart. Lactation Consultation Note:  Mother had request a follow up [place] appt at 12:00 for feeding assistance. When [facility] phoned NICU at 11:50, NICU nurse reports that mother has left to go back to room to be discharged.   Arrived in mothers room for discharge teaching. Mother reports that she is concerned that she may have a pugged duct on the Left breast. Mother reports that she pumped 40 ml from Rt and only 10 ml from [place].   Observed mother's Lt breast and area of congestion about the size of palm of hand was noted at the top of the left breast  No observed redness or warmth to area. Mother reports slightly tender. The rest of the breast feels soft and non-tender.  Mother reports that she has had a biopsy on her rt breast for cystic breast.  Observed that mothers areola's are very pink. Mother reports switching from both pumps with different size flanges.   Assist mother with massage technique to soften area. Encouraged to apply slightly warm compress before she does breast massage.  Suggested that mother so hands on pumping Mother reports that her breast have not become very firm . Mother reports that she last pumped at 9am. She was advised to pump well every 2-3 hours for 15-20 mins. Encouraged mother to be consistent with pumping.  Father reports that mother is pumping for 30 mins. Each pumping. Mother reports that she prefers the use of her own pump which is a Oncologist.  Suggested that mother use the Symphony a hospital grade pump for the first mother to establish a good milk supply.  Mother was informed of S/S of mastitis.  Mother advised to rest more frequently . Mother request a follow up feeding assessment at 6pm today.  Mother was given Breastfeeding Your NICU Baby with instructions. She was also given Medela Sp for cleaning services and parts.     Patient Name: Jacqueline Parker [name] Date: 08/16/2019 Reason for consult: Follow-up  assessment   Maternal Data    Feeding Feeding Type: Donor Breast Milk Nipple Type: Nfant Slow Flow (purple)  LATCH Score                   Interventions    Lactation Tools Discussed/Used     Consult Status Consult Status: Follow-up Date: 08/16/19 Follow-up type: In-patient    Jess Barters [facility] 08/16/2019, 2:08 PM

## 2019-08-16 NOTE — Discharge Summary (Signed)
Obstetric Discharge Summary Reason for Admission: onset of labor with twins / mild preeclampsia Prenatal Procedures: ultrasound Intrapartum Procedures: spontaneous vaginal delivery, vacuum, GBS prophylaxis and epidural Postpartum Procedures: none Complications-Operative and Postpartum: 2nd degree perineal laceration Hemoglobin  Date Value Ref Range Status  08/16/2019 10.6 (L) 12.0 - 15.0 g/dL Final   HCT  Date Value Ref Range Status  08/16/2019 31.2 (L) 36.0 - 46.0 % Final    Physical Exam:  General: alert, cooperative and no distress Lochia: appropriate Uterine Fundus: firm Perineum: mild edema DVT Evaluation: No evidence of DVT seen on physical exam. Extremities: 3+ pedal edema  Discharge Diagnoses: Pre-term twin gestation with vaginal delivery x 2 / mild preeclampsia / dependent edema  Discharge Information: Date: 08/16/2019 Activity: pelvic rest Diet: low salt - increase plain water intake Medications: PNV, Ibuprofen, Iron and Procardia 30 XL and HCTZ 25mg  x 5 days Condition: stable Instructions: refer to practice specific booklet Discharge to: home Follow-up Information    Brien Few, MD. Schedule an appointment as soon as possible for a visit in 3 day(s).   Specialty: Obstetrics and Gynecology Why: Blood pressure and swelling recheck Contact information: [place] [address] [phone]           Newborn Data:   Lelania, Bia [[id number]]  Live born female  Birth Weight: 5 lb 0.1 oz (2270 g) APGAR: 48, 9  Newborn Delivery   Birth date/time: 08/12/2019 10:24:00 Delivery type: Vaginal, Spontaneous       Steinfeldt, GERALDYN SHAIN [[id number]]  Live born female  Birth Weight: 4 lb 9.4 oz (2080 g) APGAR: 8, 9  Newborn Delivery   Birth date/time: 08/12/2019 10:32:00 Delivery type: Vaginal, Vacuum (Extractor)      Home with mother.  Gaelyn Tukes 08/16/2019, 11:27 AM

## 2019-08-16 NOTE — Progress Notes (Signed)
PPD 4 SVD-twins / preeclampsia  Subjective:   Newborn twins in NICU - patient in NICU  Objective:   VS: BP 130/80   Pulse 88   Temp 98.1 F (36.7 C) (Oral)   Resp 18   Ht 5\' 4"  (1.626 m)   Wt 82 kg   SpO2 100%   Breastfeeding Unknown   BMI 31.03 kg/m    BP: 130/80 - 131/89 - 146/90   Assessment / Plan: PPD # 4 Re-visit this am  Artelia Laroche CNM, MSN, [facility] 08/16/2019, 6:32 AM

## 2019-08-16 NOTE — Discharge Instructions (Signed)
REST with FEET elevated x 1 week If sitting have feet up  Water intake with diuretic  Take blood pressure medication and diuretic in AM upon waking  Call if worsening swelling, headache, vision changes, upper abdominal pain

## 2019-08-21 ENCOUNTER — Ambulatory Visit: Payer: Self-pay

## 2019-08-21 NOTE — Lactation Note (Signed)
This note was copied from a baby's chart. Lactation Consultation Note  Patient Name: Jacqueline Parker Peed PYYFR'T Date: 08/21/2019   Mother not in room.  Lactation will follow up to assist with feeding.     Maternal Data    Feeding Feeding Type: Breast Fed  LATCH Score Latch: Grasps breast easily, tongue down, lips flanged, rhythmical sucking.  Audible Swallowing: Spontaneous and intermittent  Type of Nipple: Everted at rest and after stimulation  Comfort (Breast/Nipple): Soft / non-tender  Hold (Positioning): No assistance needed to correctly position infant at breast.  LATCH Score: 10  Interventions Interventions: Skin to skin;Expressed milk;Support pillows  Lactation Tools Discussed/Used     Consult Status      Carlye Grippe 08/21/2019, 4:14 PM

## 2019-08-21 NOTE — Lactation Note (Signed)
This note was copied from a baby's chart. Lactation Consultation Note  Patient Name: Jacqueline Parker WNUUV'O Date: 08/21/2019 Reason for consult: Follow-up assessment;NICU baby;Multiple gestation;Late-preterm 34-36.6wks;Mother's request;1st time breastfeeding;Primapara;Infant < 6lbs  1250-1306 Called by RN to assist with 1200 feeding.  P1 mom of twins delivered @ 34 wks with corrected GA of 35.3wks. A with 2% over birth weight and B with 2% under birth weight. Both parents at bedside during [facility] consult.  Mom is rooming in at NICU and using DEBP at NICU bedside. Mom prefers her personal Motif pump to Medela pump; states it's more comfortable and she produces more volume. Mom currently pumping during LC assessment and flange noted to be too small. Mom states larger flange has been ordered and anticipated delivery today. Reviewed proper flange fit and mom verbalizes understanding.  Mom reports both babies prefer her left breast in cross cradle hold. Mom states she cannot get either baby to latch to right breast. Mom states she thinks her right breast produces more and has a faster letdown than the left breast. Mom with large pendulous breasts and large nipples.   Mom states she always feeds the babies in the same order with A feeding first and then B. Encouraged mom to alternate feeding order so that both babies can get the same amount of foremilk and hindmilk. Encouraged mom to continue to try to latch the babies to the right breast in various holds to find one that the babies will tolerate. Encouraged mom to try football hold position as that closely mimics the cross cradle hold on the opposite breast.  Reviewed limiting latch attempts to 5 minutes and feeding at the breast to 30 minutes.  Reviewed IP lactation services and phone number. Encouraged to call for [facility] assistance as needed.   Interventions Interventions: Skin to skin;Breast massage;Expressed milk;DEBP;Position options  Lactation  Tools Discussed/Used WIC Program: No Pump Review: Setup, frequency, and cleaning;Milk Storage   Consult Status Consult Status: PRN Follow-up type: In-patient    Cranston Neighbor 08/21/2019, 1:18 PM

## 2021-05-20 IMAGING — US US ABDOMEN LIMITED
1 series · 15 of 25 positions shown · non-contrast
Comparison: Prior radiograph from 10/17/2011.

CLINICAL DATA: Initial evaluation for acute right upper quadrant
pain, nausea, vomiting.

EXAM:
ULTRASOUND ABDOMEN LIMITED RIGHT UPPER QUADRANT

[Series 1: us abdomen limited · 33 acquisitions, 15 frames shown]
[im 1/33]
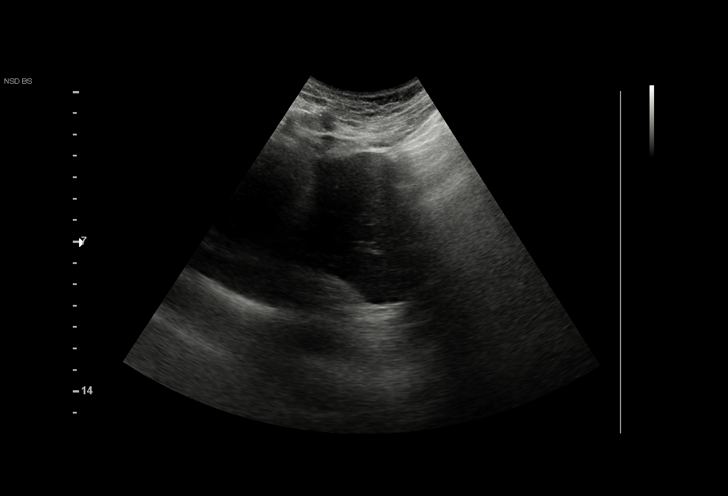
[im 3/33]
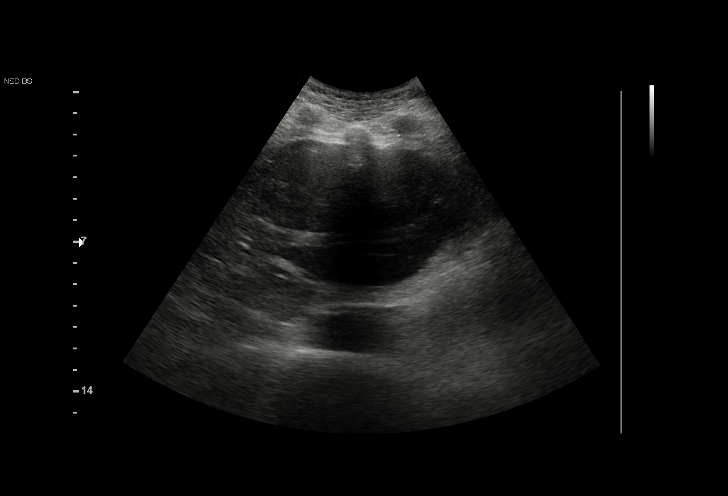
[im 6/33]
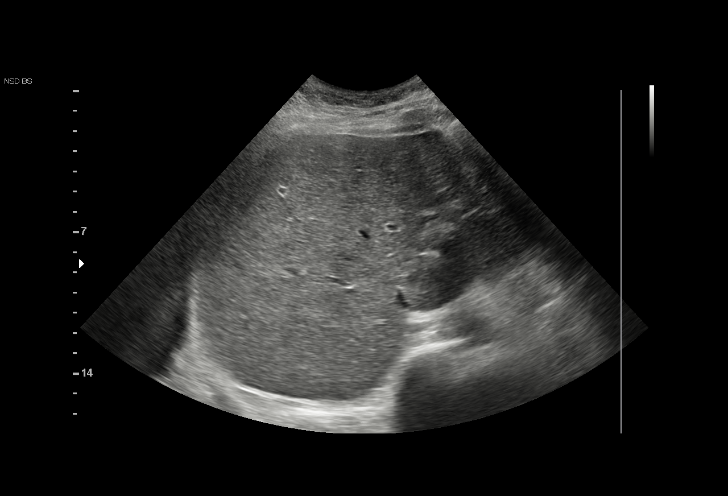
[im 7/33]
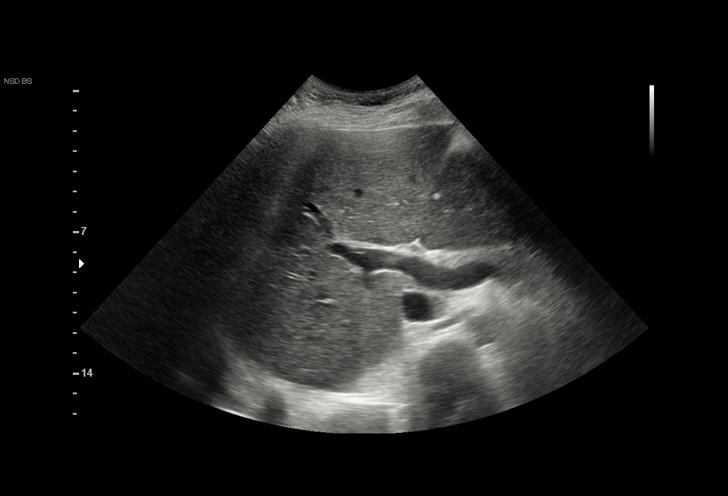
[im 10/33]
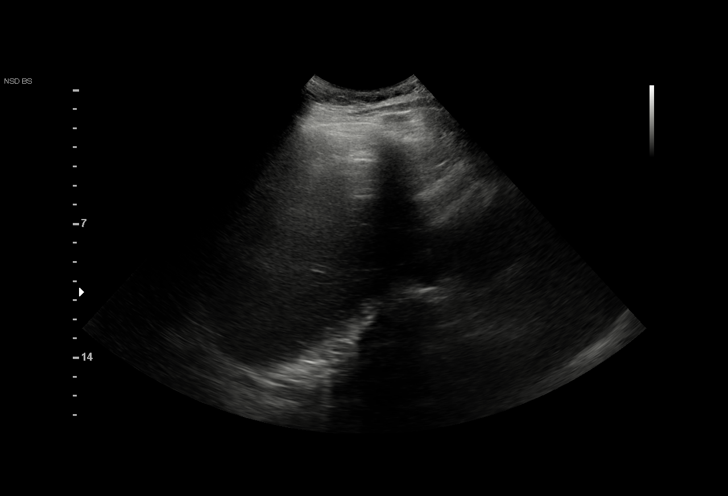
[im 13/33]
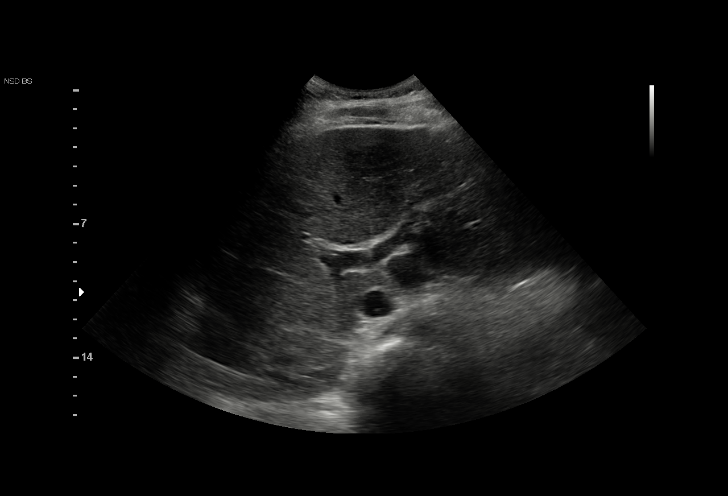
[im 14/33]
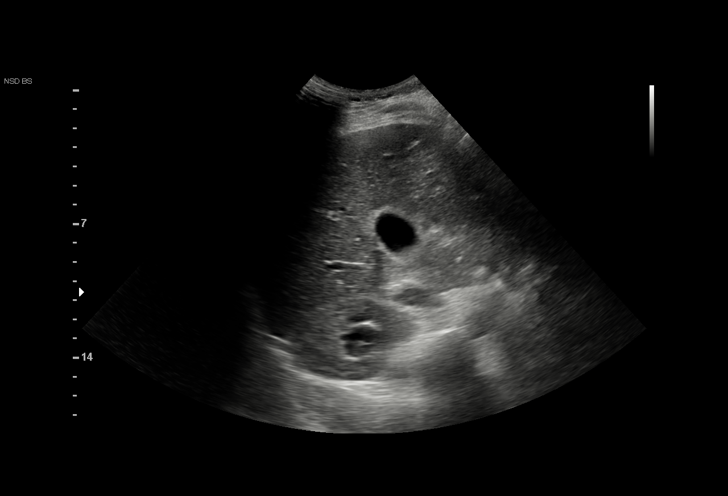
[im 17/33]
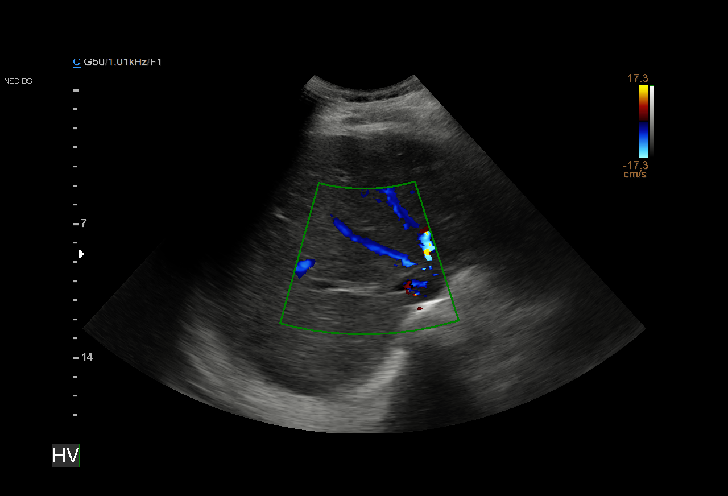
[im 19/33]
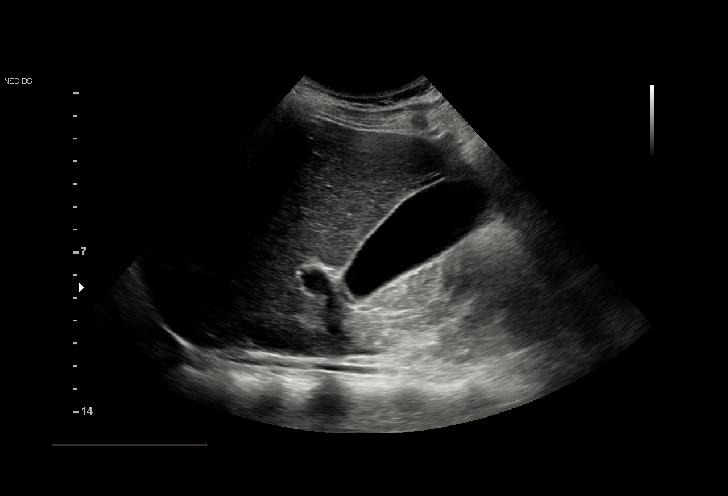
[im 21/33]
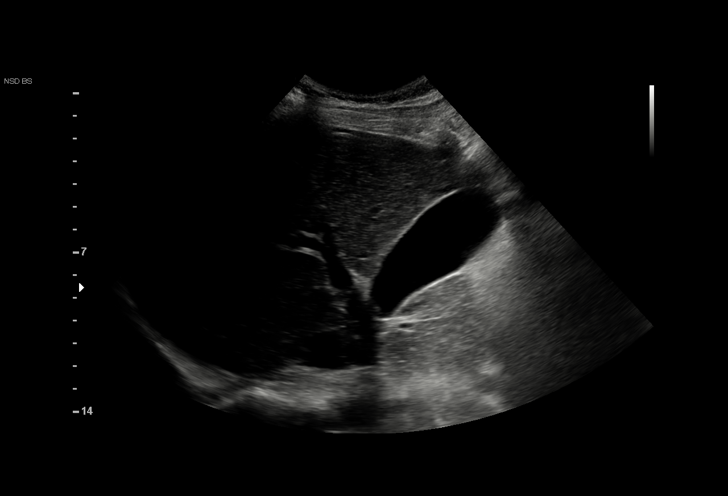
[im 23/33]
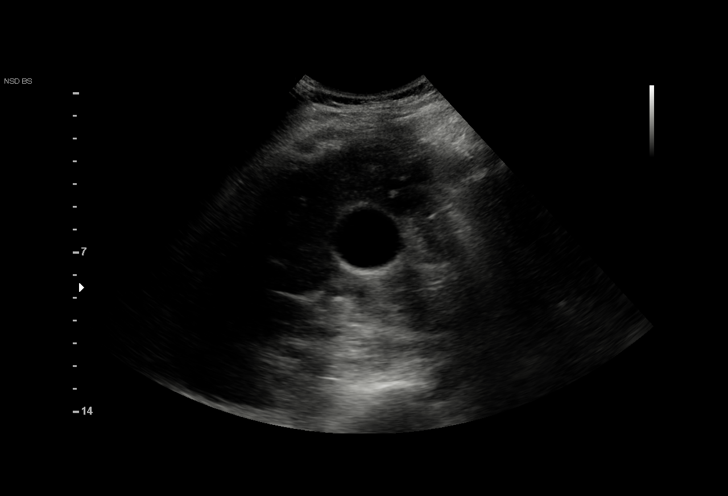
[im 26/33]
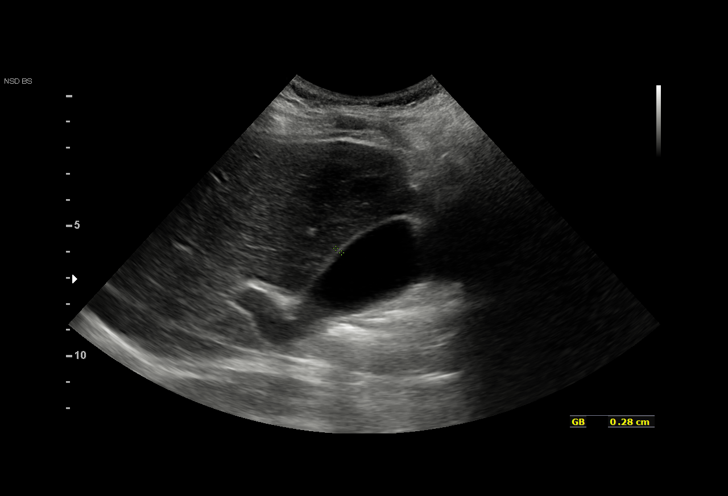
[im 27/33]
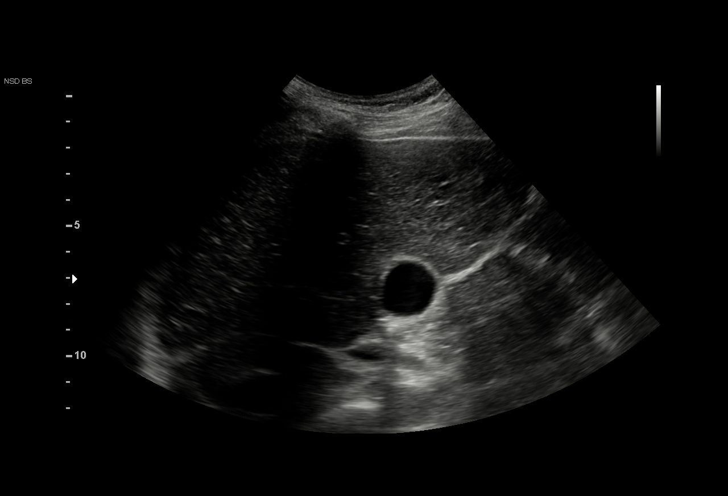
[im 30/33]
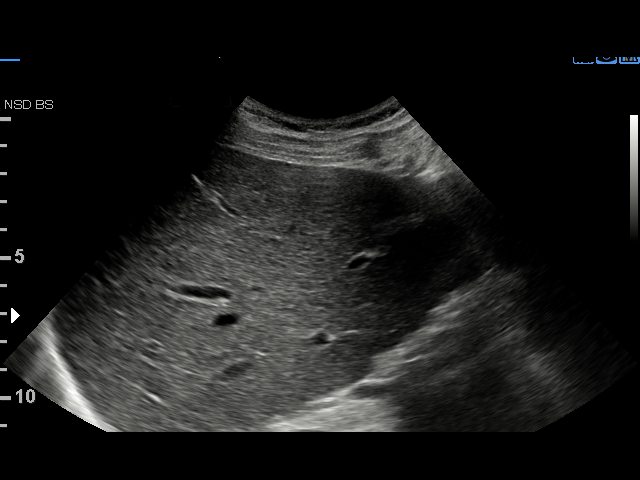
[im 33/33]
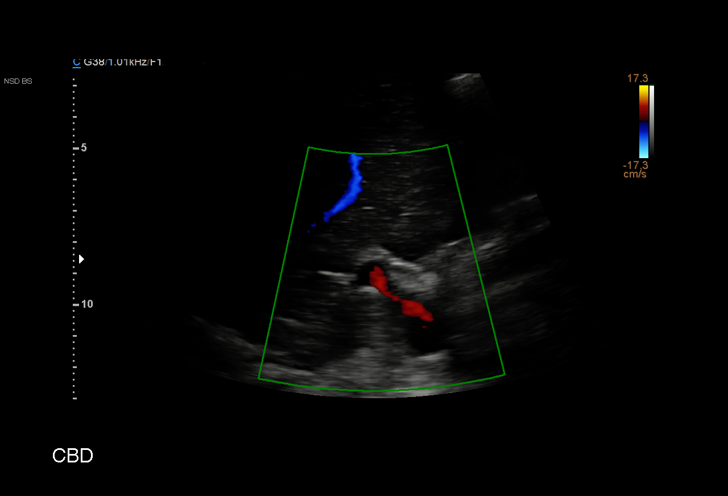

[15 of 25 positions shown; findings below may reference images not displayed]

FINDINGS: Gallbladder:

No gallstones or wall thickening visualized. No sonographic Murphy
sign noted by sonographer.

Common bile duct:

Diameter: 3.4 mm

Liver:

No focal lesion identified. Within normal limits in parenchymal
echogenicity. Portal vein is patent on color Doppler imaging with
normal direction of blood flow towards the liver.

Other: None.
IMPRESSION: Normal right upper quadrant ultrasound.
